# Patient Record
Sex: Female | Born: 1965 | Race: White | Hispanic: No | Marital: Single | Smoking: Former smoker
Health system: Southern US, Community
[De-identification: ages and names within clinical notes are randomized; demographics above are authoritative.]

## PROBLEM LIST (undated history)

## (undated) DIAGNOSIS — E785 Hyperlipidemia, unspecified: Secondary | ICD-10-CM

## (undated) DIAGNOSIS — K219 Gastro-esophageal reflux disease without esophagitis: Secondary | ICD-10-CM

## (undated) DIAGNOSIS — I10 Essential (primary) hypertension: Secondary | ICD-10-CM

## (undated) DIAGNOSIS — N809 Endometriosis, unspecified: Secondary | ICD-10-CM

## (undated) DIAGNOSIS — M1990 Unspecified osteoarthritis, unspecified site: Secondary | ICD-10-CM

## (undated) DIAGNOSIS — E119 Type 2 diabetes mellitus without complications: Secondary | ICD-10-CM

## (undated) DIAGNOSIS — C801 Malignant (primary) neoplasm, unspecified: Secondary | ICD-10-CM

## (undated) DIAGNOSIS — F32A Depression, unspecified: Secondary | ICD-10-CM

## (undated) HISTORY — PX: DIAGNOSTIC LAPAROSCOPY: SUR761

## (undated) HISTORY — DX: Hyperlipidemia, unspecified: E78.5

---

## 2000-01-20 ENCOUNTER — Other Ambulatory Visit: Admission: RE | Admit: 2000-01-20 | Discharge: 2000-01-20 | Payer: Self-pay | Admitting: Gynecology

## 2011-02-09 ENCOUNTER — Ambulatory Visit: Payer: Self-pay | Admitting: Family Medicine

## 2012-06-06 ENCOUNTER — Ambulatory Visit: Payer: Self-pay

## 2012-06-16 ENCOUNTER — Ambulatory Visit: Payer: Self-pay

## 2012-06-16 HISTORY — PX: DILATION AND CURETTAGE OF UTERUS: SHX78

## 2012-10-17 ENCOUNTER — Emergency Department: Payer: Self-pay | Admitting: Emergency Medicine

## 2012-10-17 LAB — COMPREHENSIVE METABOLIC PANEL
Alkaline Phosphatase: 125 U/L (ref 50–136)
Anion Gap: 7 (ref 7–16)
BUN: 6 mg/dL — ABNORMAL LOW (ref 7–18)
Bilirubin,Total: 0.5 mg/dL (ref 0.2–1.0)
Chloride: 105 mmol/L (ref 98–107)
Co2: 26 mmol/L (ref 21–32)
Creatinine: 0.59 mg/dL — ABNORMAL LOW (ref 0.60–1.30)
EGFR (African American): >60
Glucose: 113 mg/dL — ABNORMAL HIGH (ref 65–99)
Potassium: 3.6 mmol/L (ref 3.5–5.1)
SGOT(AST): 60 U/L — ABNORMAL HIGH (ref 15–37)
SGPT (ALT): 150 U/L — ABNORMAL HIGH (ref 12–78)
Sodium: 138 mmol/L (ref 136–145)
Total Protein: 7.6 g/dL (ref 6.4–8.2)

## 2012-10-17 LAB — CBC WITH DIFFERENTIAL/PLATELET
Basophil #: 0 10*3/uL (ref 0.0–0.1)
Eosinophil #: 0 10*3/uL (ref 0.0–0.7)
Eosinophil %: 0.5 %
HGB: 12.9 g/dL (ref 12.0–16.0)
MCH: 30.8 pg (ref 26.0–34.0)
MCHC: 34.4 g/dL (ref 32.0–36.0)
Monocyte %: 5.2 %
Neutrophil #: 5.4 10*3/uL (ref 1.4–6.5)
Neutrophil %: 78.5 %
RDW: 13.1 % (ref 11.5–14.5)
WBC: 6.9 10*3/uL (ref 3.6–11.0)

## 2012-10-17 LAB — TROPONIN I: Troponin-I: <0.02 ng/mL

## 2013-09-03 ENCOUNTER — Ambulatory Visit: Payer: Self-pay | Admitting: Family Medicine

## 2015-01-14 NOTE — Op Note (Signed)
PATIENT NAME:  Patricia Caldwell, Patricia Caldwell MR#:  [id number] DATE OF BIRTH:  14-Apr-1966  DATE OF PROCEDURE:  06/16/2012  PREOPERATIVE DIAGNOSIS: Abnormal uterine bleeding, retained intrauterine device.   POSTOPERATIVE DIAGNOSIS:  Abnormal uterine bleeding, retained intrauterine device.    OPERATIONS PERFORMED:  1. Dilation and curettage.  2. Hysteroscopy.  3. Removal of intrauterine device.   SURGEON: Wonda Cheng. Laurey Morale, M.D.   ANESTHESIA: General.   OPERATIVE FINDINGS: Good pelvic support, uterus sounded to 10 cm, intrauterine device.   DESCRIPTION OF PROCEDURE: After adequate general anesthesia, the patient was prepped and draped in routine fashion. The cervix was grasped with Jacob's tenaculum demonstrating good pelvic support. The cervix was dilated with ease. The uterine cavity was visualized which was essentially normal except the IUD. Hysteroscopy instruments were removed. IUD was removed. Endometrial curettage was performed with return of minimal amount of tissue. The patient tolerated the procedure well and left the Operating Room in good condition. Sponge and needle counts were said to be correct at the end of the procedure.   ____________________________ Wonda Cheng. Laurey Morale, MD pjr:ap D: 06/16/2012 13:04:59 ET T: 06/16/2012 13:26:58 ET JOB#: [id number]  cc: Wonda Cheng. Laurey Morale, MD, <Dictator> Rosina Lowenstein MD ELECTRONICALLY SIGNED 06/19/2012 22:55

## 2015-06-30 ENCOUNTER — Other Ambulatory Visit: Payer: Self-pay | Admitting: Family Medicine

## 2015-06-30 DIAGNOSIS — Z1231 Encounter for screening mammogram for malignant neoplasm of breast: Secondary | ICD-10-CM

## 2015-07-08 ENCOUNTER — Ambulatory Visit
Admission: RE | Admit: 2015-07-08 | Discharge: 2015-07-08 | Disposition: A | Discharge status: Home / Self Care | Payer: 59 | Source: Ambulatory Visit | Attending: Family Medicine | Admitting: Family Medicine

## 2015-07-08 DIAGNOSIS — Z1231 Encounter for screening mammogram for malignant neoplasm of breast: Secondary | ICD-10-CM | POA: Insufficient documentation

## 2017-03-09 ENCOUNTER — Other Ambulatory Visit: Payer: Self-pay | Admitting: Obstetrics and Gynecology

## 2017-03-09 DIAGNOSIS — Z1231 Encounter for screening mammogram for malignant neoplasm of breast: Secondary | ICD-10-CM

## 2017-03-25 ENCOUNTER — Ambulatory Visit
Admission: RE | Admit: 2017-03-25 | Discharge: 2017-03-25 | Disposition: A | Discharge status: Home / Self Care | Payer: 59 | Source: Ambulatory Visit | Attending: Obstetrics and Gynecology | Admitting: Obstetrics and Gynecology

## 2017-03-25 ENCOUNTER — Encounter: Payer: Self-pay | Admitting: Radiology

## 2017-03-25 DIAGNOSIS — Z1231 Encounter for screening mammogram for malignant neoplasm of breast: Secondary | ICD-10-CM | POA: Diagnosis present

## 2018-07-10 ENCOUNTER — Ambulatory Visit: Payer: 59

## 2019-12-19 ENCOUNTER — Other Ambulatory Visit: Payer: Self-pay | Admitting: Family Medicine

## 2019-12-19 DIAGNOSIS — Z1231 Encounter for screening mammogram for malignant neoplasm of breast: Secondary | ICD-10-CM

## 2020-01-01 ENCOUNTER — Ambulatory Visit
Admission: RE | Admit: 2020-01-01 | Discharge: 2020-01-01 | Disposition: A | Discharge status: Home / Self Care | Payer: Self-pay | Source: Ambulatory Visit | Attending: Family Medicine | Admitting: Family Medicine

## 2020-01-01 DIAGNOSIS — Z1231 Encounter for screening mammogram for malignant neoplasm of breast: Secondary | ICD-10-CM | POA: Insufficient documentation

## 2021-09-22 ENCOUNTER — Other Ambulatory Visit: Payer: Self-pay | Admitting: Family Medicine

## 2021-09-22 DIAGNOSIS — Z1231 Encounter for screening mammogram for malignant neoplasm of breast: Secondary | ICD-10-CM

## 2021-10-04 ENCOUNTER — Encounter: Payer: Self-pay | Admitting: Family Medicine

## 2021-10-06 ENCOUNTER — Telehealth: Payer: Self-pay

## 2021-10-06 NOTE — Telephone Encounter (Signed)
CALLED PATIENT NO ANSWER LEFT VOICEMAIL FOR A CALL BACK  letter sent 

## 2021-10-06 NOTE — Telephone Encounter (Signed)
CALLED PATIENT NO ANSWER LEFT VOICEMAIL FOR A CALL BACK ? ?

## 2021-11-30 ENCOUNTER — Ambulatory Visit
Admission: RE | Admit: 2021-11-30 | Discharge: 2021-11-30 | Disposition: A | Discharge status: Home / Self Care | Payer: BC Managed Care – PPO | Source: Ambulatory Visit | Attending: Family Medicine | Admitting: Family Medicine

## 2021-11-30 ENCOUNTER — Other Ambulatory Visit: Payer: Self-pay

## 2021-11-30 DIAGNOSIS — Z1231 Encounter for screening mammogram for malignant neoplasm of breast: Secondary | ICD-10-CM | POA: Diagnosis not present

## 2021-12-03 ENCOUNTER — Other Ambulatory Visit: Payer: Self-pay | Admitting: Family Medicine

## 2021-12-03 DIAGNOSIS — R921 Mammographic calcification found on diagnostic imaging of breast: Secondary | ICD-10-CM

## 2021-12-03 DIAGNOSIS — R928 Other abnormal and inconclusive findings on diagnostic imaging of breast: Secondary | ICD-10-CM

## 2021-12-22 ENCOUNTER — Other Ambulatory Visit: Payer: Self-pay

## 2021-12-22 ENCOUNTER — Ambulatory Visit
Admission: RE | Admit: 2021-12-22 | Discharge: 2021-12-22 | Disposition: A | Discharge status: Home / Self Care | Payer: BC Managed Care – PPO | Source: Ambulatory Visit | Attending: Family Medicine | Admitting: Family Medicine

## 2021-12-22 DIAGNOSIS — R921 Mammographic calcification found on diagnostic imaging of breast: Secondary | ICD-10-CM | POA: Insufficient documentation

## 2021-12-22 DIAGNOSIS — R928 Other abnormal and inconclusive findings on diagnostic imaging of breast: Secondary | ICD-10-CM | POA: Diagnosis not present

## 2021-12-22 DIAGNOSIS — R922 Inconclusive mammogram: Secondary | ICD-10-CM | POA: Diagnosis not present

## 2021-12-23 ENCOUNTER — Other Ambulatory Visit: Payer: Self-pay | Admitting: Gerontology

## 2021-12-23 ENCOUNTER — Other Ambulatory Visit: Payer: Self-pay | Admitting: Family Medicine

## 2022-01-06 ENCOUNTER — Other Ambulatory Visit: Payer: Self-pay | Admitting: Family Medicine

## 2022-01-06 DIAGNOSIS — R921 Mammographic calcification found on diagnostic imaging of breast: Secondary | ICD-10-CM

## 2022-01-06 DIAGNOSIS — R928 Other abnormal and inconclusive findings on diagnostic imaging of breast: Secondary | ICD-10-CM

## 2022-01-27 ENCOUNTER — Ambulatory Visit
Admission: RE | Admit: 2022-01-27 | Discharge: 2022-01-27 | Disposition: A | Discharge status: Home / Self Care | Payer: BC Managed Care – PPO | Source: Ambulatory Visit | Attending: Family Medicine | Admitting: Family Medicine

## 2022-01-27 DIAGNOSIS — R921 Mammographic calcification found on diagnostic imaging of breast: Secondary | ICD-10-CM

## 2022-01-27 DIAGNOSIS — R928 Other abnormal and inconclusive findings on diagnostic imaging of breast: Secondary | ICD-10-CM | POA: Diagnosis not present

## 2022-01-27 DIAGNOSIS — N6021 Fibroadenosis of right breast: Secondary | ICD-10-CM | POA: Diagnosis not present

## 2022-01-27 HISTORY — PX: BREAST BIOPSY: SHX20

## 2022-01-28 LAB — SURGICAL PATHOLOGY

## 2022-04-10 ENCOUNTER — Emergency Department: Payer: BC Managed Care – PPO

## 2022-04-10 ENCOUNTER — Other Ambulatory Visit: Payer: Self-pay

## 2022-04-10 ENCOUNTER — Emergency Department
Admission: EM | Admit: 2022-04-10 | Discharge: 2022-04-10 | Disposition: A | Discharge status: Home / Self Care | Payer: BC Managed Care – PPO | Attending: Emergency Medicine | Admitting: Emergency Medicine

## 2022-04-10 DIAGNOSIS — K8050 Calculus of bile duct without cholangitis or cholecystitis without obstruction: Secondary | ICD-10-CM

## 2022-04-10 DIAGNOSIS — R079 Chest pain, unspecified: Secondary | ICD-10-CM | POA: Diagnosis not present

## 2022-04-10 DIAGNOSIS — I10 Essential (primary) hypertension: Secondary | ICD-10-CM | POA: Diagnosis not present

## 2022-04-10 DIAGNOSIS — K8020 Calculus of gallbladder without cholecystitis without obstruction: Secondary | ICD-10-CM | POA: Diagnosis not present

## 2022-04-10 DIAGNOSIS — E119 Type 2 diabetes mellitus without complications: Secondary | ICD-10-CM | POA: Insufficient documentation

## 2022-04-10 DIAGNOSIS — R1013 Epigastric pain: Secondary | ICD-10-CM | POA: Diagnosis not present

## 2022-04-10 DIAGNOSIS — K760 Fatty (change of) liver, not elsewhere classified: Secondary | ICD-10-CM | POA: Diagnosis not present

## 2022-04-10 DIAGNOSIS — R1011 Right upper quadrant pain: Secondary | ICD-10-CM | POA: Diagnosis not present

## 2022-04-10 HISTORY — DX: Type 2 diabetes mellitus without complications: E11.9

## 2022-04-10 HISTORY — DX: Essential (primary) hypertension: I10

## 2022-04-10 LAB — COMPREHENSIVE METABOLIC PANEL
ALT: 70 U/L — ABNORMAL HIGH (ref 0–44)
AST: 117 U/L — ABNORMAL HIGH (ref 15–41)
Albumin: 3.9 g/dL (ref 3.5–5.0)
Alkaline Phosphatase: 125 U/L (ref 38–126)
Anion gap: 8 (ref 5–15)
BUN: 18 mg/dL (ref 6–20)
CO2: 30 mmol/L (ref 22–32)
Calcium: 8.8 mg/dL — ABNORMAL LOW (ref 8.9–10.3)
Chloride: 102 mmol/L (ref 98–111)
Creatinine, Ser: 0.61 mg/dL (ref 0.44–1.00)
GFR, Estimated: >60 mL/min (ref >60)
Glucose, Bld: 119 mg/dL — ABNORMAL HIGH (ref 70–99)
Potassium: 3.2 mmol/L — ABNORMAL LOW (ref 3.5–5.1)
Sodium: 140 mmol/L (ref 135–145)
Total Bilirubin: 0.7 mg/dL (ref 0.3–1.2)
Total Protein: 7.1 g/dL (ref 6.5–8.1)

## 2022-04-10 LAB — CBC
HCT: 42.8 % (ref 36.0–46.0)
Hemoglobin: 14.4 g/dL (ref 12.0–15.0)
MCH: 28.6 pg (ref 26.0–34.0)
MCHC: 33.6 g/dL (ref 30.0–36.0)
MCV: 85.1 fL (ref 80.0–100.0)
Platelets: 358 10*3/uL (ref 150–400)
RBC: 5.03 MIL/uL (ref 3.87–5.11)
RDW: 13.1 % (ref 11.5–15.5)
WBC: 10.7 10*3/uL — ABNORMAL HIGH (ref 4.0–10.5)
nRBC: 0 % (ref 0.0–0.2)

## 2022-04-10 LAB — TROPONIN I (HIGH SENSITIVITY): Troponin I (High Sensitivity): 4 ng/L (ref <18)

## 2022-04-10 LAB — LIPASE, BLOOD: Lipase: 54 U/L — ABNORMAL HIGH (ref 11–51)

## 2022-04-10 NOTE — ED Triage Notes (Signed)
First RN note: pt to ED via POV, states epigastric abd pain and c/o "heaviness on my chest". Pt states pain started earlier today at approx 1300. Pt denies radiations of heaviness in her chest. Pt also c/o SOB, able to speak in full and complete sentences at this time. Pt also states has passed gas but has not relieved her symptoms.   Pt A&O x4, NAD noted, ambulatory to triage desk, able to speak in full and complete sentences, pt states thinks it is her gallbladder at this time.

## 2022-04-10 NOTE — ED Provider Notes (Signed)
[facility] Provider Note    Event Date/Time   First MD Initiated Contact with Patient 04/10/22 2039     (approximate)  History   Chief Complaint: Abdominal Pain and Chest Pain  HPI  Patricia Caldwell is a 56 y.o. female with a past medical history of diabetes, hypertension, presents emergency department for epigastric pain.  According to the patient since around 2 PM today she has been experiencing pain in epigastrium.  Patient denies any chest pain or significant shortness of breath.  Patient denies any similar pains previously.  Denies any alcohol use or Tylenol use.  Patient still has her gallbladder.  Pain did occur after eating lunch.  Physical Exam   Triage Vital Signs: ED Triage Vitals  Enc Vitals Group     BP 04/10/22 1940 135/83     Pulse Rate 04/10/22 1940 83     Resp 04/10/22 1940 16     Temp 04/10/22 1940 98.4 F (36.9 C)     Temp Source 04/10/22 1940 Oral     SpO2 04/10/22 1940 95 %     Weight 04/10/22 1939 202 lb (91.6 kg)     Height 04/10/22 1939 '5\' 5"'$  (1.651 m)     Head Circumference --      Peak Flow --      Pain Score 04/10/22 1934 7     Pain Loc --      Pain Edu? --      Excl. in [place]? --     Most recent vital signs: Vitals:   04/10/22 1940  BP: 135/83  Pulse: 83  Resp: 16  Temp: 98.4 F (36.9 C)  SpO2: 95%    General: Awake, no distress.  CV:  Good peripheral perfusion.  Regular rate and rhythm  Resp:  Normal effort.  Equal breath sounds bilaterally.  Abd:  No distention.  Soft, mild epigastric tenderness to palpation no rebound or guarding.  No significant right upper quadrant tenderness.    ED Results / Procedures / Treatments   EKG  EKG viewed and interpreted by myself shows a normal sinus rhythm at 83 bpm with a narrow QRS, normal axis, normal intervals, no concerning ST changes.  RADIOLOGY  I have personally reviewed and interpreted the chest x-ray images no acute abnormality seen on my  evaluation. Radiology has read the chest x-ray is negative Ultrasound shows cholelithiasis without cholecystitis  MEDICATIONS ORDERED IN ED: Medications - No data to display   IMPRESSION / MDM / [place] / ED COURSE  I reviewed the triage vital signs and the nursing notes.  Patient's presentation is most consistent with acute presentation with potential threat to life or bodily function.  Patient presents emergency department for epigastric discomfort.  According to the patient since around 2 PM today she has been experiencing pain in the epigastrium although states it is considerably improved now compared to earlier today.  Patient does have mild epigastric tenderness to palpation.  Patient's labs have resulted showing moderate LFT elevation as well as a very slight lipase elevation.  Slight leukocytosis on CBC.  Reassuringly negative troponin.  Differential would include cholecystitis, biliary colic, choledocholithiasis, pancreatitis, gastritis, peptic ulcer disease.  We will obtain a right upper quadrant ultrasound to further evaluate.  Patient agreeable to plan of care.  Patient denies any alcohol or Tylenol use denies any prior liver disease.  Ultrasound is resulted showing cholelithiasis without acute signs of cholecystitis.  Patient states she is now pain-free  and without any intervention in the emergency department.  We will feed the patient in the emergency department if the patient's pain returns we will consult surgery for symptomatic cholelithiasis/biliary colic.  If pain remains subsided we will discharge with referral to surgery to discuss outpatient cholecystectomy.  Patient agreeable to plan.   Patient has eaten food with no return of discomfort.  Suspect likely biliary colic.  Discussed with the patient calling the surgeon on Monday and adhering to a low-fat diet.  I also discussed if the pain returns she should return immediately to the emergency department she should  also return if she has any fever scleral icterus or jaundice.  Patient agreeable to plan of care.   FINAL CLINICAL IMPRESSION(S) / ED DIAGNOSES   Epigastric pain    Note:  This document was prepared using Dragon voice recognition software and may include unintentional dictation errors.   Harvest Dark, MD 04/10/22 2207

## 2022-04-10 NOTE — Discharge Instructions (Addendum)
As we discussed please call this number provided for general surgery Monday morning to arrange a follow-up appointment as soon as possible.  If you have any return of pain, fever, yellowing of the eyes or skin please return immediately to the emergency department for further evaluation.  As we discussed please adhere to a low-fat/low oil diet over the next several weeks.

## 2022-04-15 ENCOUNTER — Ambulatory Visit: Payer: Self-pay | Admitting: Surgery

## 2022-04-15 ENCOUNTER — Encounter: Payer: Self-pay | Admitting: Surgery

## 2022-04-15 ENCOUNTER — Ambulatory Visit: Payer: BC Managed Care – PPO | Admitting: Surgery

## 2022-04-15 VITALS — BP 119/69 | HR 73 | Temp 97.9°F | Ht 65.0 in | Wt 206.8 lb

## 2022-04-15 DIAGNOSIS — K8010 Calculus of gallbladder with chronic cholecystitis without obstruction: Secondary | ICD-10-CM

## 2022-04-15 NOTE — Progress Notes (Signed)
Patient ID: Patricia Caldwell, female   DOB: 1966-01-20, 56 y.o.   MRN: [id number]  Chief Complaint: Gallstones  History of Present Illness Patricia Caldwell is a 56 y.o. female with an isolated episode of severe epigastric pain, nonradiating, following a meal of wings from wing stop.  ED evaluation showed mild AST and alk phos abnormalities but otherwise normal.  Ultrasound showed stones within normal common bile duct.  He has been avoiding fried or greasy foods since that time.  Past Medical History Past Medical History:  Diagnosis Date   Diabetes mellitus without complication ([place])    Hypertension       Past Surgical History:  Procedure Laterality Date   BREAST BIOPSY Right 01/27/2022   stereo bx-calcs "X" clip-path pending    No Known Allergies  Current Outpatient Medications  Medication Sig Dispense Refill   esomeprazole (NEXIUM) 40 MG capsule Take 40 mg by mouth daily.     FLUoxetine (PROZAC) 20 MG capsule Take 60 mg by mouth every morning.     JARDIANCE 10 MG TABS tablet Take 10 mg by mouth daily.     propranolol (INDERAL) 10 MG tablet Take 10 mg by mouth 2 (two) times daily.     tirzepatide (MOUNJARO) 15 MG/0.5ML Pen Inject 15 mg into the skin once a week.     No current facility-administered medications for this visit.    Family History Family History  Problem Relation Age of Onset   Breast cancer Maternal Aunt 85   Breast cancer Maternal Grandmother 28      Social History Social History   Tobacco Use   Smoking status: Former    Types: Cigarettes    Quit date: 2020    Years since quitting: 3.5   Smokeless tobacco: Never  Substance Use Topics   Alcohol use: Never   Drug use: Never        Review of Systems  Constitutional: Negative.   HENT: Negative.    Eyes: Negative.   Respiratory:  Positive for shortness of breath.   Cardiovascular: Negative.   Gastrointestinal:  Positive for abdominal pain.  Genitourinary: Negative.   Skin: Negative.    Neurological: Negative.   Psychiatric/Behavioral: Negative.        Physical Exam Blood pressure 119/69, pulse 73, temperature 97.9 F (36.6 C), temperature source Oral, height _0  (1.651 m), weight 206 lb 12.8 oz (93.8 kg), last menstrual period 01/25/2017, SpO2 93 %. Last Weight  Most recent update: 04/15/2022  4:15 PM    Weight  93.8 kg (206 lb 12.8 oz)             CONSTITUTIONAL: Well developed, and nourished, appropriately responsive and aware without distress.   EYES: Sclera non-icteric.   EARS, NOSE, MOUTH AND THROAT:  The oropharynx is clear. Oral mucosa is pink and moist.   Hearing is intact to voice.  NECK: Trachea is midline, and there is no jugular venous distension.  LYMPH NODES:  Lymph nodes in the neck are not enlarged. RESPIRATORY:  Lungs are clear, and breath sounds are equal bilaterally. Normal respiratory effort without pathologic use of accessory muscles. CARDIOVASCULAR: Heart is regular in rate and rhythm. GI: The abdomen is soft, nontender, and nondistended. There were no palpable masses. I did not appreciate hepatosplenomegaly. There were normal bowel sounds. MUSCULOSKELETAL:  Symmetrical muscle tone appreciated in all four extremities.    SKIN: Skin turgor is normal. No pathologic skin lesions appreciated.  NEUROLOGIC:  Motor and sensation appear grossly normal.  Cranial nerves are grossly without defect. PSYCH:  Alert and oriented to person, place and time. Affect is appropriate for situation.  Data Reviewed I have personally reviewed what is currently available of the patient's imaging, recent labs and medical records.   Labs:     Latest Ref Rng & Units 04/10/2022    7:46 PM 10/17/2012    3:46 PM 06/06/2012    9:57 AM  CBC  WBC 4.0 - 10.5 K/uL 10.7  6.9    Hemoglobin 12.0 - 15.0 g/dL 14.4  12.9    Hematocrit 36.0 - 46.0 % 42.8  37.4  39.2   Platelets 150 - 400 K/uL 358  249        Latest Ref Rng & Units 04/10/2022    7:46 PM 10/17/2012    3:46 PM   CMP  Glucose 70 - 99 mg/dL 119  113   BUN 6 - 20 mg/dL 18  6   Creatinine 0.44 - 1.00 mg/dL 0.61  0.59   Sodium 135 - 145 mmol/L 140  138   Potassium 3.5 - 5.1 mmol/L 3.2  3.6   Chloride 98 - 111 mmol/L 102  105   CO2 22 - 32 mmol/L 30  26   Calcium 8.9 - 10.3 mg/dL 8.8  8.5   Total Protein 6.5 - 8.1 g/dL 7.1  7.6   Total Bilirubin 0.3 - 1.2 mg/dL 0.7  0.5   Alkaline Phos 38 - 126 U/L 125  125   AST 15 - 41 U/L 117  60   ALT 0 - 44 U/L 70  150       Imaging: Radiology review:  CLINICAL DATA:  Right upper quadrant abdominal pain   EXAM: ULTRASOUND ABDOMEN LIMITED RIGHT UPPER QUADRANT   COMPARISON:  None Available.   FINDINGS: Gallbladder:   Multiple layering gallstones are seen within the gallbladder. The gallbladder, however, is mildly decompressed and no pericholecystic fluid is identified. The sonographic Percell Miller sign is reportedly negative.   Common bile duct:   Diameter: 5 mm in proximal diameter   Liver:   Hepatic parenchymal echogenicity is diffusely increased and there is mild coarsening of the hepatic echotexture in keeping with mild hepatic steatosis. No focal intrahepatic masses are seen and there is no intrahepatic biliary ductal dilation. Portal vein is patent on color Doppler imaging with normal direction of blood flow towards the liver.   Other: None.   IMPRESSION: 1. Cholelithiasis without sonographic evidence of acute cholecystitis. 2. Mild hepatic steatosis.     Electronically Signed   By: Fidela Salisbury M.D.   On: 04/10/2022 21:15 Within last 24 hrs: No results found.  Assessment    Chronic calculus cholecystitis with biliary colic  There are no problems to display for this patient.   Plan    Robotic cholecystectomy with ICG imaging  This was discussed thoroughly.  Optimal plan is for robotic cholecystectomy utilizing ICG imaging. Risks and benefits have been discussed with the patient which include but are not limited to  anesthesia, bleeding, infection, biliary ductal injury, resulting in leak or stenosis, other associated unanticipated injuries affiliated with laparoscopic surgery.   Reviewed that removing the gallbladder will only address the symptoms related to the gallbladder itself.  I believe there is the desire to proceed, accepting the risks with understanding.  Questions elicited and answered to satisfaction.    No guarantees ever expressed or implied.   Face-to-face time spent with the patient and accompanying care providers(if present) was 20  minutes, with more than 50% of the time spent counseling, educating, and coordinating care of the patient.    These notes generated with voice recognition software. I apologize for typographical errors.  Ronny Bacon M.D., FACS 04/15/2022, 7:23 PM

## 2022-04-15 NOTE — H&P (View-Only) (Signed)
Patient ID: CHEVETTE FEE, female   DOB: 1966-01-20, 56 y.o.   MRN: [id number]  Chief Complaint: Gallstones  History of Present Illness KELBY LOTSPEICH is a 56 y.o. female with an isolated episode of severe epigastric pain, nonradiating, following a meal of wings from wing stop.  ED evaluation showed mild AST and alk phos abnormalities but otherwise normal.  Ultrasound showed stones within normal common bile duct.  He has been avoiding fried or greasy foods since that time.  Past Medical History Past Medical History:  Diagnosis Date   Diabetes mellitus without complication ([place])    Hypertension       Past Surgical History:  Procedure Laterality Date   BREAST BIOPSY Right 01/27/2022   stereo bx-calcs "X" clip-path pending    No Known Allergies  Current Outpatient Medications  Medication Sig Dispense Refill   esomeprazole (NEXIUM) 40 MG capsule Take 40 mg by mouth daily.     FLUoxetine (PROZAC) 20 MG capsule Take 60 mg by mouth every morning.     JARDIANCE 10 MG TABS tablet Take 10 mg by mouth daily.     propranolol (INDERAL) 10 MG tablet Take 10 mg by mouth 2 (two) times daily.     tirzepatide (MOUNJARO) 15 MG/0.5ML Pen Inject 15 mg into the skin once a week.     No current facility-administered medications for this visit.    Family History Family History  Problem Relation Age of Onset   Breast cancer Maternal Aunt 85   Breast cancer Maternal Grandmother 28      Social History Social History   Tobacco Use   Smoking status: Former    Types: Cigarettes    Quit date: 2020    Years since quitting: 3.5   Smokeless tobacco: Never  Substance Use Topics   Alcohol use: Never   Drug use: Never        Review of Systems  Constitutional: Negative.   HENT: Negative.    Eyes: Negative.   Respiratory:  Positive for shortness of breath.   Cardiovascular: Negative.   Gastrointestinal:  Positive for abdominal pain.  Genitourinary: Negative.   Skin: Negative.    Neurological: Negative.   Psychiatric/Behavioral: Negative.        Physical Exam Blood pressure 119/69, pulse 73, temperature 97.9 F (36.6 C), temperature source Oral, height _0  (1.651 m), weight 206 lb 12.8 oz (93.8 kg), last menstrual period 01/25/2017, SpO2 93 %. Last Weight  Most recent update: 04/15/2022  4:15 PM    Weight  93.8 kg (206 lb 12.8 oz)             CONSTITUTIONAL: Well developed, and nourished, appropriately responsive and aware without distress.   EYES: Sclera non-icteric.   EARS, NOSE, MOUTH AND THROAT:  The oropharynx is clear. Oral mucosa is pink and moist.   Hearing is intact to voice.  NECK: Trachea is midline, and there is no jugular venous distension.  LYMPH NODES:  Lymph nodes in the neck are not enlarged. RESPIRATORY:  Lungs are clear, and breath sounds are equal bilaterally. Normal respiratory effort without pathologic use of accessory muscles. CARDIOVASCULAR: Heart is regular in rate and rhythm. GI: The abdomen is soft, nontender, and nondistended. There were no palpable masses. I did not appreciate hepatosplenomegaly. There were normal bowel sounds. MUSCULOSKELETAL:  Symmetrical muscle tone appreciated in all four extremities.    SKIN: Skin turgor is normal. No pathologic skin lesions appreciated.  NEUROLOGIC:  Motor and sensation appear grossly normal.  Cranial nerves are grossly without defect. PSYCH:  Alert and oriented to person, place and time. Affect is appropriate for situation.  Data Reviewed I have personally reviewed what is currently available of the patient's imaging, recent labs and medical records.   Labs:     Latest Ref Rng & Units 04/10/2022    7:46 PM 10/17/2012    3:46 PM 06/06/2012    9:57 AM  CBC  WBC 4.0 - 10.5 K/uL 10.7  6.9    Hemoglobin 12.0 - 15.0 g/dL 14.4  12.9    Hematocrit 36.0 - 46.0 % 42.8  37.4  39.2   Platelets 150 - 400 K/uL 358  249        Latest Ref Rng & Units 04/10/2022    7:46 PM 10/17/2012    3:46 PM   CMP  Glucose 70 - 99 mg/dL 119  113   BUN 6 - 20 mg/dL 18  6   Creatinine 0.44 - 1.00 mg/dL 0.61  0.59   Sodium 135 - 145 mmol/L 140  138   Potassium 3.5 - 5.1 mmol/L 3.2  3.6   Chloride 98 - 111 mmol/L 102  105   CO2 22 - 32 mmol/L 30  26   Calcium 8.9 - 10.3 mg/dL 8.8  8.5   Total Protein 6.5 - 8.1 g/dL 7.1  7.6   Total Bilirubin 0.3 - 1.2 mg/dL 0.7  0.5   Alkaline Phos 38 - 126 U/L 125  125   AST 15 - 41 U/L 117  60   ALT 0 - 44 U/L 70  150       Imaging: Radiology review:  CLINICAL DATA:  Right upper quadrant abdominal pain   EXAM: ULTRASOUND ABDOMEN LIMITED RIGHT UPPER QUADRANT   COMPARISON:  None Available.   FINDINGS: Gallbladder:   Multiple layering gallstones are seen within the gallbladder. The gallbladder, however, is mildly decompressed and no pericholecystic fluid is identified. The sonographic Percell Miller sign is reportedly negative.   Common bile duct:   Diameter: 5 mm in proximal diameter   Liver:   Hepatic parenchymal echogenicity is diffusely increased and there is mild coarsening of the hepatic echotexture in keeping with mild hepatic steatosis. No focal intrahepatic masses are seen and there is no intrahepatic biliary ductal dilation. Portal vein is patent on color Doppler imaging with normal direction of blood flow towards the liver.   Other: None.   IMPRESSION: 1. Cholelithiasis without sonographic evidence of acute cholecystitis. 2. Mild hepatic steatosis.     Electronically Signed   By: Fidela Salisbury M.D.   On: 04/10/2022 21:15 Within last 24 hrs: No results found.  Assessment    Chronic calculus cholecystitis with biliary colic  There are no problems to display for this patient.   Plan    Robotic cholecystectomy with ICG imaging  This was discussed thoroughly.  Optimal plan is for robotic cholecystectomy utilizing ICG imaging. Risks and benefits have been discussed with the patient which include but are not limited to  anesthesia, bleeding, infection, biliary ductal injury, resulting in leak or stenosis, other associated unanticipated injuries affiliated with laparoscopic surgery.   Reviewed that removing the gallbladder will only address the symptoms related to the gallbladder itself.  I believe there is the desire to proceed, accepting the risks with understanding.  Questions elicited and answered to satisfaction.    No guarantees ever expressed or implied.   Face-to-face time spent with the patient and accompanying care providers(if present) was 20  minutes, with more than 50% of the time spent counseling, educating, and coordinating care of the patient.    These notes generated with voice recognition software. I apologize for typographical errors.  Ronny Bacon M.D., FACS 04/15/2022, 7:23 PM

## 2022-04-15 NOTE — Patient Instructions (Signed)
Our surgery scheduler Barbara will call you within 24-48 hours to get you scheduled. If you have not heard from her after 48 hours, please call our office. Have the blue sheet available when she calls to write down important information.   If you have any concerns or questions, please feel free to call our office.    Minimally Invasive Cholecystectomy  Minimally invasive cholecystectomy is surgery to remove the gallbladder. The gallbladder is a pear-shaped organ that lies beneath the liver on the right side of the body. The gallbladder stores bile, which is a fluid that helps the body digest fats. Cholecystectomy is often done to treat inflammation (irritation and swelling) of the gallbladder (cholecystitis). This condition is usually caused by a buildup of gallstones (cholelithiasis) in the gallbladder or when the fluid in the gall bladder becomes stagnant because gallstones get stuck in the ducts (tubes) and block the flow of bile. This can result in inflammation and pain. In severe cases, emergency surgery may be required. This procedure is done through small incisions in the abdomen, instead of one large incision. It is also called laparoscopic surgery. A thin scope with a camera (laparoscope) is inserted through one incision. Then surgical instruments are inserted through the other incisions. In some cases, a minimally invasive surgery may need to be changed to a surgery that is done through a larger incision. This is called open surgery. Tell a health care provider about: Any allergies you have. All medicines you are taking, including vitamins, herbs, eye drops, creams, and over-the-counter medicines. Any problems you or family members have had with anesthetic medicines. Any bleeding problems you have. Any surgeries you have had. Any medical conditions you have. Whether you are pregnant or may be pregnant. What are the risks? Generally, this is a safe procedure. However, problems may occur,  including: Infection. Bleeding. Allergic reactions to medicines. Damage to nearby structures or organs. A gallstone remaining in the common bile duct. The common bile duct carries bile from the gallbladder to the small intestine. A bile leak from the liver or cystic duct after your gallbladder is removed. What happens before the procedure? When to stop eating and drinking Follow instructions from your health care provider about what you may eat and drink before your procedure. These may include: 8 hours before the procedure Stop eating most foods. Do not eat meat, fried foods, or fatty foods. Eat only light foods, such as toast or crackers. All liquids are okay except energy drinks and alcohol. 6 hours before the procedure Stop eating. Drink only clear liquids, such as water, clear fruit juice, black coffee, plain tea, and sports drinks. Do not drink energy drinks or alcohol. 2 hours before the procedure Stop drinking all liquids. You may be allowed to take medicines with small sips of water. If you do not follow your health care provider's instructions, your procedure may be delayed or canceled. Medicines Ask your health care provider about: Changing or stopping your regular medicines. This is especially important if you are taking diabetes medicines or blood thinners. Taking medicines such as aspirin and ibuprofen. These medicines can thin your blood. Do not take these medicines unless your health care provider tells you to take them. Taking over-the-counter medicines, vitamins, herbs, and supplements. General instructions If you will be going home right after the procedure, plan to have a responsible adult: Take you home from the hospital or clinic. You will not be allowed to drive. Care for you for the time you are   told. Do not use any products that contain nicotine or tobacco for at least 4 weeks before the procedure. These products include cigarettes, chewing tobacco, and vaping  devices, such as e-cigarettes. If you need help quitting, ask your health care provider. Ask your health care provider: How your surgery site will be marked. What steps will be taken to help prevent infection. These may include: Removing hair at the surgery site. Washing skin with a germ-killing soap. Taking antibiotic medicine. What happens during the procedure?  An IV will be inserted into one of your veins. You will be given one or both of the following: A medicine to help you relax (sedative). A medicine to make you fall asleep (general anesthetic). Your surgeon will make several small incisions in your abdomen. The laparoscope will be inserted through one of the small incisions. The camera on the laparoscope will send images to a monitor in the operating room. This lets your surgeon see inside your abdomen. A gas will be pumped into your abdomen. This will expand your abdomen to give the surgeon more room to perform the surgery. Other tools that are needed for the procedure will be inserted through the other incisions. The gallbladder will be removed through one of the incisions. Your common bile duct may be examined. If stones are found in the common bile duct, they may be removed. After your gallbladder has been removed, the incisions will be closed with stitches (sutures), staples, or skin glue. Your incisions will be covered with a bandage (dressing). The procedure may vary among health care providers and hospitals. What happens after the procedure? Your blood pressure, heart rate, breathing rate, and blood oxygen level will be monitored until you leave the hospital or clinic. You will be given medicines as needed to control your pain. You may have a drain placed in the incision. The drain will be removed a day or two after the procedure. Summary Minimally invasive cholecystectomy, also called laparoscopic cholecystectomy, is surgery to remove the gallbladder using small  incisions. Tell your health care provider about all the medical conditions you have and all the medicines you are taking for those conditions. Before the procedure, follow instructions about when to stop eating and drinking and changing or stopping medicines. Plan to have a responsible adult care for you for the time you are told after you leave the hospital or clinic. This information is not intended to replace advice given to you by your health care provider. Make sure you discuss any questions you have with your health care provider. Document Revised: 03/17/2021 Document Reviewed: 03/17/2021 Elsevier Patient Education  2023 [employer].  

## 2022-04-19 ENCOUNTER — Telehealth: Payer: Self-pay | Admitting: Surgery

## 2022-04-19 NOTE — Telephone Encounter (Signed)
Patient has been advised of Pre-Admission date/time, and Surgery date.  Surgery Date: 05/03/22 Preadmission Testing Date: 04/23/22 (phone 1p-5p)  Patient has been made aware to call [phone], between 1-3:00pm the day before surgery, to find out what time to arrive for surgery.

## 2022-04-23 ENCOUNTER — Inpatient Hospital Stay
Admission: RE | Admit: 2022-04-23 | Discharge: 2022-04-23 | Disposition: A | Discharge status: Home / Self Care | Payer: BC Managed Care – PPO | Source: Ambulatory Visit

## 2022-04-23 NOTE — Patient Instructions (Signed)
Your procedure is scheduled on: Monday, August 7 Report to the Registration Desk on the 1st floor of the [facility]. To find out your arrival time, please call [phone] between 1PM - 3PM on: Friday, August 4 If your arrival time is 6:00 am, do not arrive prior to that time as the [facility] entrance doors do not open until 6:00 am.  REMEMBER: Instructions that are not followed completely may result in serious medical risk, up to and including death; or upon the discretion of your surgeon and anesthesiologist your surgery may need to be rescheduled.  Do not eat or drink after midnight the night before surgery.  No gum chewing, lozengers or hard candies.  TAKE THESE MEDICATIONS THE MORNING OF SURGERY WITH A SIP OF WATER:  Esomeprazole (Nexium) - (take one the night before and one on the morning of surgery - helps to prevent nausea after surgery.) Fluoxetine (Prozac) Propranolol  Jardiance - hold for 3 days prior to surgery. Last day to TAKE Jardiance is Thursday, August 3. Resume AFTER surgery.  Tirzepatide Darcel Bayley) - hold for 7 days prior to surgery. Restart AFTER surgery on your normal day.  One week prior to surgery: starting July 31 Stop Anti-inflammatories (NSAIDS) such as Advil, Aleve, Ibuprofen, Motrin, Naproxen, Naprosyn and Aspirin based products such as Excedrin, Goodys Powder, BC Powder. Stop ANY OVER THE COUNTER supplements until after surgery. You may however, continue to take Tylenol if needed for pain up until the day of surgery.  No Alcohol for 24 hours before or after surgery.  No Smoking including e-cigarettes for 24 hours prior to surgery.  No chewable tobacco products for at least 6 hours prior to surgery.  No nicotine patches on the day of surgery.  Do not use any "recreational" drugs for at least a week prior to your surgery.  Please be advised that the combination of cocaine and anesthesia may have negative outcomes, up to and including death. If you  test positive for cocaine, your surgery will be cancelled.  On the morning of surgery brush your teeth with toothpaste and water, you may rinse your mouth with mouthwash if you wish. Do not swallow any toothpaste or mouthwash.  Use CHG Soap as directed on instruction sheet.  Do not wear jewelry, make-up, hairpins, clips or nail polish.  Do not wear lotions, powders, or perfumes.   Do not shave body from the neck down 48 hours prior to surgery just in case you cut yourself which could leave a site for infection.  Also, freshly shaved skin may become irritated if using the CHG soap.  Contact lenses, hearing aids and dentures may not be worn into surgery.  Do not bring valuables to the hospital. [facility] is not responsible for any missing/lost belongings or valuables.   Bring your C-PAP to the hospital with you in case you may have to spend the night.   Notify your doctor if there is any change in your medical condition (cold, fever, infection).  Wear comfortable clothing (specific to your surgery type) to the hospital.  After surgery, you can help prevent lung complications by doing breathing exercises.  Take deep breaths and cough every 1-2 hours. Your doctor may order a device called an Incentive Spirometer to help you take deep breaths. When coughing or sneezing, hold a pillow firmly against your incision with both hands. This is called "splinting." Doing this helps protect your incision. It also decreases belly discomfort.  If you are being discharged the  day of surgery, you will not be allowed to drive home. You will need a responsible adult (18 years or older) to drive you home and stay with you that night.   If you are taking public transportation, you will need to have a responsible adult (18 years or older) with you. Please confirm with your physician that it is acceptable to use public transportation.   Please call the [place] Dept. at [phone] if you  have any questions about these instructions.  Surgery Visitation Policy:  Patients undergoing a surgery or procedure may have two family members or support persons with them as long as the person is not COVID-19 positive or experiencing its symptoms.   Preparing for Surgery with [place] (CHG) Soap    Before surgery, you can play an important role by reducing the number of germs on your skin.  CHG (Chlorhexidine gluconate) soap is an antiseptic cleanser which kills germs and bonds with the skin to continue killing germs even after washing.  Please do not use if you have an allergy to CHG or antibacterial soaps. If your skin becomes reddened/irritated stop using the CHG.  1. Shower the NIGHT BEFORE SURGERY and the MORNING OF SURGERY with CHG soap.  2. If you choose to wash your hair, wash your hair first as usual with your normal shampoo.  3. After shampooing, rinse your hair and body thoroughly to remove the shampoo.  4. Use CHG as you would any other liquid soap. You can apply CHG directly to the skin and wash gently with a scrungie or a clean washcloth.  5. Apply the CHG soap to your body only from the neck down. Do not use on open wounds or open sores. Avoid contact with your eyes, ears, mouth, and genitals (private parts). Wash face and genitals (private parts) with your normal soap.  6. Wash thoroughly, paying special attention to the area where your surgery will be performed.  7. Thoroughly rinse your body with warm water.  8. Do not shower/wash with your normal soap after using and rinsing off the CHG soap.  9. Pat yourself dry with a clean towel.  10. Wear clean pajamas to bed the night before surgery.  12. Place clean sheets on your bed the night of your first shower and do not sleep with pets.  13. Shower again with the CHG soap on the day of surgery prior to arriving at the hospital.  14. Do not apply any deodorants/lotions/powders.  15. Please wear  clean clothes to the hospital.

## 2022-04-27 ENCOUNTER — Encounter
Admission: RE | Admit: 2022-04-27 | Discharge: 2022-04-27 | Disposition: A | Discharge status: Home / Self Care | Payer: BC Managed Care – PPO | Source: Ambulatory Visit | Attending: Surgery | Admitting: Surgery

## 2022-04-27 ENCOUNTER — Other Ambulatory Visit: Payer: Self-pay

## 2022-04-27 DIAGNOSIS — Z01812 Encounter for preprocedural laboratory examination: Secondary | ICD-10-CM

## 2022-04-27 HISTORY — DX: Gastro-esophageal reflux disease without esophagitis: K21.9

## 2022-04-27 HISTORY — DX: Unspecified osteoarthritis, unspecified site: M19.90

## 2022-04-27 HISTORY — DX: Endometriosis, unspecified: N80.9

## 2022-04-27 HISTORY — DX: Malignant (primary) neoplasm, unspecified: C80.1

## 2022-04-27 HISTORY — DX: Depression, unspecified: F32.A

## 2022-04-27 NOTE — Patient Instructions (Addendum)
Your procedure is scheduled on: 05/03/22 - Monday  Report to the Registration Desk on the 1st floor of the [facility]. To find out your arrival time, please call [phone] between 1PM - 3PM on: 04/30/22 - Friday If your arrival time is 6:00 am, do not arrive prior to that time as the [facility] entrance doors do not open until 6:00 am.  REMEMBER: Instructions that are not followed completely may result in serious medical risk, up to and including death; or upon the discretion of your surgeon and anesthesiologist your surgery may need to be rescheduled.  Do not eat food or drinking any fluids after midnight the night before surgery.  No gum chewing, lozengers or hard candies.  You may however, drink CLEAR liquids up to 2 hours before you are scheduled to arrive for your surgery. Do not drink anything within 2 hours of your scheduled arrival time.  TAKE THESE MEDICATIONS THE MORNING OF SURGERY WITH A SIP OF WATER:  - esomeprazole (NEXIUM) 40 MG capsule - (take one the night before and one on the morning of surgery - helps to prevent nausea after surgery.) - FLUoxetine (PROZAC) - propranolol (INDERAL)    Hold your tirzepatide [facility]) for 7 days prior to your surgery.  Stop taking your JARDIANCE beginning 04/30/22, you may resume taking on the day after surgery.  One week prior to surgery: Stop Anti-inflammatories (NSAIDS) such as Advil, Aleve, Ibuprofen, Motrin, Naproxen, Naprosyn and Aspirin based products such as Excedrin, Goodys Powder, BC Powder.  Stop ANY OVER THE COUNTER supplements until after surgery.  You may however, continue to take Tylenol if needed for pain up until the day of surgery.  No Alcohol for 24 hours before or after surgery.  No Smoking including e-cigarettes for 24 hours prior to surgery.  No chewable tobacco products for at least 6 hours prior to surgery.  No nicotine patches on the day of surgery.  Do not use any "recreational" drugs for at  least a week prior to your surgery.  Please be advised that the combination of cocaine and anesthesia may have negative outcomes, up to and including death. If you test positive for cocaine, your surgery will be cancelled.  On the morning of surgery brush your teeth with toothpaste and water, you may rinse your mouth with mouthwash if you wish. Do not swallow any toothpaste or mouthwash.  Do not wear jewelry, make-up, hairpins, clips or nail polish.  Do not wear lotions, powders, or perfumes.   Do not shave body from the neck down 48 hours prior to surgery just in case you cut yourself which could leave a site for infection.  Also, freshly shaved skin may become irritated if using the CHG soap.  Contact lenses, hearing aids and dentures may not be worn into surgery.  Do not bring valuables to the hospital. [facility] is not responsible for any missing/lost belongings or valuables.   Notify your doctor if there is any change in your medical condition (cold, fever, infection).  Wear comfortable clothing (specific to your surgery type) to the hospital.  After surgery, you can help prevent lung complications by doing breathing exercises.  Take deep breaths and cough every 1-2 hours. Your doctor may order a device called an Incentive Spirometer to help you take deep breaths. When coughing or sneezing, hold a pillow firmly against your incision with both hands. This is called "splinting." Doing this helps protect your incision. It also decreases belly discomfort.  If you  are being admitted to the hospital overnight, leave your suitcase in the car. After surgery it may be brought to your room.  If you are being discharged the day of surgery, you will not be allowed to drive home. You will need a responsible adult (18 years or older) to drive you home and stay with you that night.   If you are taking public transportation, you will need to have a responsible adult (18 years or older) with  you. Please confirm with your physician that it is acceptable to use public transportation.   Please call the [place] Dept. at [phone] if you have any questions about these instructions.  Surgery Visitation Policy:  Patients undergoing a surgery or procedure may have two family members or support persons with them as long as the person is not COVID-19 positive or experiencing its symptoms.   Inpatient Visitation:    Visiting hours are 7 a.m. to 8 p.m. Up to four visitors are allowed at one time in a patient room, including children. The visitors may rotate out with other people during the day. One designated support person (adult) may remain overnight.

## 2022-05-02 MED ORDER — INDOCYANINE GREEN 25 MG IV SOLR
1.2500 mg | Freq: Once | INTRAVENOUS | Status: AC
  Administered 2022-05-03: 1.25 mg via INTRAVENOUS
  Filled 2022-05-02: qty 0.5

## 2022-05-02 MED ORDER — SODIUM CHLORIDE 0.9 % IV SOLN: INTRAVENOUS | Status: DC

## 2022-05-02 MED ORDER — CELECOXIB 200 MG PO CAPS: 200.0000 mg | ORAL_CAPSULE | ORAL | Status: AC

## 2022-05-02 MED ORDER — ACETAMINOPHEN 500 MG PO TABS: 1000.0000 mg | ORAL_TABLET | ORAL | Status: AC

## 2022-05-02 MED ORDER — CHLORHEXIDINE GLUCONATE CLOTH 2 % EX PADS: 6.0000 | MEDICATED_PAD | Freq: Once | CUTANEOUS | Status: DC

## 2022-05-02 MED ORDER — GABAPENTIN 300 MG PO CAPS: 300.0000 mg | ORAL_CAPSULE | ORAL | Status: AC

## 2022-05-02 MED ORDER — CEFAZOLIN SODIUM-DEXTROSE 2-4 GM/100ML-% IV SOLN
2.0000 g | INTRAVENOUS | Status: AC
  Administered 2022-05-03: 2 g via INTRAVENOUS

## 2022-05-02 MED ORDER — CHLORHEXIDINE GLUCONATE 0.12 % MT SOLN: 15.0000 mL | Freq: Once | OROMUCOSAL | Status: AC

## 2022-05-02 MED ORDER — BUPIVACAINE LIPOSOME 1.3 % IJ SUSP: 20.0000 mL | Freq: Once | INTRAMUSCULAR | Status: DC

## 2022-05-02 MED ORDER — CHLORHEXIDINE GLUCONATE CLOTH 2 % EX PADS
6.0000 | MEDICATED_PAD | Freq: Once | CUTANEOUS | Status: AC
  Administered 2022-05-03: 6 via TOPICAL

## 2022-05-02 MED ORDER — ORAL CARE MOUTH RINSE: 15.0000 mL | Freq: Once | OROMUCOSAL | Status: AC

## 2022-05-03 ENCOUNTER — Encounter
Admission: RE | Disposition: A | Discharge status: Home / Self Care | Payer: Self-pay | Source: Home / Self Care | Attending: Surgery

## 2022-05-03 ENCOUNTER — Ambulatory Visit: Payer: BC Managed Care – PPO | Admitting: Certified Registered"

## 2022-05-03 ENCOUNTER — Ambulatory Visit
Admission: RE | Admit: 2022-05-03 | Discharge: 2022-05-03 | Disposition: A | Discharge status: Home / Self Care | Payer: BC Managed Care – PPO | Attending: Surgery | Admitting: Surgery

## 2022-05-03 ENCOUNTER — Encounter: Payer: Self-pay | Admitting: Surgery

## 2022-05-03 ENCOUNTER — Other Ambulatory Visit: Payer: Self-pay

## 2022-05-03 DIAGNOSIS — K8010 Calculus of gallbladder with chronic cholecystitis without obstruction: Secondary | ICD-10-CM | POA: Insufficient documentation

## 2022-05-03 DIAGNOSIS — F32A Depression, unspecified: Secondary | ICD-10-CM | POA: Diagnosis not present

## 2022-05-03 DIAGNOSIS — K219 Gastro-esophageal reflux disease without esophagitis: Secondary | ICD-10-CM | POA: Insufficient documentation

## 2022-05-03 DIAGNOSIS — E119 Type 2 diabetes mellitus without complications: Secondary | ICD-10-CM | POA: Diagnosis not present

## 2022-05-03 DIAGNOSIS — Z87891 Personal history of nicotine dependence: Secondary | ICD-10-CM | POA: Diagnosis not present

## 2022-05-03 DIAGNOSIS — K8020 Calculus of gallbladder without cholecystitis without obstruction: Secondary | ICD-10-CM | POA: Diagnosis not present

## 2022-05-03 DIAGNOSIS — I10 Essential (primary) hypertension: Secondary | ICD-10-CM | POA: Diagnosis not present

## 2022-05-03 DIAGNOSIS — Z01812 Encounter for preprocedural laboratory examination: Secondary | ICD-10-CM

## 2022-05-03 LAB — GLUCOSE, CAPILLARY
Glucose-Capillary: 143 mg/dL — ABNORMAL HIGH (ref 70–99)
Glucose-Capillary: 148 mg/dL — ABNORMAL HIGH (ref 70–99)

## 2022-05-03 SURGERY — CHOLECYSTECTOMY, ROBOT-ASSISTED, LAPAROSCOPIC
Anesthesia: General | Site: Abdomen

## 2022-05-03 MED ORDER — OXYCODONE HCL 5 MG PO TABS
ORAL_TABLET | ORAL | Status: AC
  Filled 2022-05-03: qty 1

## 2022-05-03 MED ORDER — 0.9 % SODIUM CHLORIDE (POUR BTL) OPTIME
TOPICAL | Status: DC | PRN
  Administered 2022-05-03: 500 mL

## 2022-05-03 MED ORDER — FENTANYL CITRATE (PF) 100 MCG/2ML IJ SOLN
INTRAMUSCULAR | Status: AC
  Filled 2022-05-03: qty 2

## 2022-05-03 MED ORDER — OXYCODONE HCL 5 MG PO TABS: 5.0000 mg | ORAL_TABLET | Freq: Four times a day (QID) | ORAL | 0 refills | Status: DC | PRN

## 2022-05-03 MED ORDER — OXYCODONE HCL 5 MG PO TABS
5.0000 mg | ORAL_TABLET | Freq: Once | ORAL | Status: AC | PRN
  Administered 2022-05-03: 5 mg via ORAL

## 2022-05-03 MED ORDER — CEFAZOLIN SODIUM-DEXTROSE 2-4 GM/100ML-% IV SOLN
INTRAVENOUS | Status: AC
  Filled 2022-05-03: qty 100

## 2022-05-03 MED ORDER — ONDANSETRON HCL 4 MG/2ML IJ SOLN
INTRAMUSCULAR | Status: DC | PRN
  Administered 2022-05-03: 4 mg via INTRAVENOUS

## 2022-05-03 MED ORDER — MIDAZOLAM HCL 2 MG/2ML IJ SOLN
INTRAMUSCULAR | Status: DC | PRN
  Administered 2022-05-03: 2 mg via INTRAVENOUS

## 2022-05-03 MED ORDER — MIDAZOLAM HCL 2 MG/2ML IJ SOLN
INTRAMUSCULAR | Status: AC
  Filled 2022-05-03: qty 2

## 2022-05-03 MED ORDER — LIDOCAINE HCL (CARDIAC) PF 100 MG/5ML IV SOSY
PREFILLED_SYRINGE | INTRAVENOUS | Status: DC | PRN
  Administered 2022-05-03: 50 mg via INTRAVENOUS

## 2022-05-03 MED ORDER — PROPOFOL 10 MG/ML IV BOLUS
INTRAVENOUS | Status: DC | PRN
  Administered 2022-05-03: 150 mg via INTRAVENOUS

## 2022-05-03 MED ORDER — LACTATED RINGERS IV SOLN: INTRAVENOUS | Status: DC | PRN

## 2022-05-03 MED ORDER — IBUPROFEN 800 MG PO TABS: 800.0000 mg | ORAL_TABLET | Freq: Three times a day (TID) | ORAL | 0 refills | Status: AC | PRN

## 2022-05-03 MED ORDER — SUGAMMADEX SODIUM 200 MG/2ML IV SOLN
INTRAVENOUS | Status: DC | PRN
  Administered 2022-05-03: 200 mg via INTRAVENOUS

## 2022-05-03 MED ORDER — FENTANYL CITRATE (PF) 100 MCG/2ML IJ SOLN
INTRAMUSCULAR | Status: DC | PRN
Start: 2022-05-03 — End: 2022-05-03
  Administered 2022-05-03 (×2): 50 ug via INTRAVENOUS

## 2022-05-03 MED ORDER — CELECOXIB 200 MG PO CAPS
ORAL_CAPSULE | ORAL | Status: AC
  Administered 2022-05-03: 200 mg via ORAL
  Filled 2022-05-03: qty 1

## 2022-05-03 MED ORDER — CHLORHEXIDINE GLUCONATE 0.12 % MT SOLN
OROMUCOSAL | Status: AC
  Administered 2022-05-03: 15 mL via OROMUCOSAL
  Filled 2022-05-03: qty 15

## 2022-05-03 MED ORDER — PHENYLEPHRINE HCL-NACL 20-0.9 MG/250ML-% IV SOLN
INTRAVENOUS | Status: DC | PRN
  Administered 2022-05-03: 40 ug/min via INTRAVENOUS

## 2022-05-03 MED ORDER — OXYCODONE HCL 5 MG/5ML PO SOLN: 5.0000 mg | Freq: Once | ORAL | Status: AC | PRN

## 2022-05-03 MED ORDER — PHENYLEPHRINE HCL (PRESSORS) 10 MG/ML IV SOLN
INTRAVENOUS | Status: DC | PRN
  Administered 2022-05-03: 80 ug via INTRAVENOUS
  Administered 2022-05-03 (×3): 160 ug via INTRAVENOUS

## 2022-05-03 MED ORDER — FENTANYL CITRATE (PF) 100 MCG/2ML IJ SOLN
25.0000 ug | INTRAMUSCULAR | Status: DC | PRN
  Administered 2022-05-03: 25 ug via INTRAVENOUS

## 2022-05-03 MED ORDER — ACETAMINOPHEN 500 MG PO TABS
ORAL_TABLET | ORAL | Status: AC
  Administered 2022-05-03: 1000 mg via ORAL
  Filled 2022-05-03: qty 2

## 2022-05-03 MED ORDER — BUPIVACAINE-EPINEPHRINE (PF) 0.25% -1:200000 IJ SOLN
INTRAMUSCULAR | Status: DC | PRN
  Administered 2022-05-03: 30 mL

## 2022-05-03 MED ORDER — ROCURONIUM BROMIDE 100 MG/10ML IV SOLN
INTRAVENOUS | Status: DC | PRN
  Administered 2022-05-03: 70 mg via INTRAVENOUS

## 2022-05-03 MED ORDER — GABAPENTIN 300 MG PO CAPS
ORAL_CAPSULE | ORAL | Status: AC
  Administered 2022-05-03: 300 mg via ORAL
  Filled 2022-05-03: qty 1

## 2022-05-03 MED ORDER — BUPIVACAINE-EPINEPHRINE (PF) 0.25% -1:200000 IJ SOLN
INTRAMUSCULAR | Status: AC
  Filled 2022-05-03: qty 30

## 2022-05-03 SURGICAL SUPPLY — 47 items
ADH SKN CLS APL DERMABOND .7 (GAUZE/BANDAGES/DRESSINGS) ×1
BAG PRESSURE INF REUSE 3000 (BAG) IMPLANT
CLIP LIGATING HEM O LOK PURPLE (MISCELLANEOUS) ×2 IMPLANT
COVER TIP SHEARS 8 DVNC (MISCELLANEOUS) ×1 IMPLANT
COVER TIP SHEARS 8MM DA VINCI (MISCELLANEOUS) ×2
DERMABOND ADVANCED (GAUZE/BANDAGES/DRESSINGS) ×1
DERMABOND ADVANCED .7 DNX12 (GAUZE/BANDAGES/DRESSINGS) ×1 IMPLANT
DRAPE ARM DVNC X/XI (DISPOSABLE) ×4 IMPLANT
DRAPE COLUMN DVNC XI (DISPOSABLE) ×1 IMPLANT
DRAPE DA VINCI XI ARM (DISPOSABLE) ×8
DRAPE DA VINCI XI COLUMN (DISPOSABLE) ×2
ELECT CAUTERY BLADE 6.4 (BLADE) ×2 IMPLANT
GLOVE ORTHO TXT STRL SZ7.5 (GLOVE) ×4 IMPLANT
GOWN STRL REUS W/ TWL LRG LVL3 (GOWN DISPOSABLE) ×2 IMPLANT
GOWN STRL REUS W/ TWL XL LVL3 (GOWN DISPOSABLE) ×2 IMPLANT
GOWN STRL REUS W/TWL LRG LVL3 (GOWN DISPOSABLE) ×4
GOWN STRL REUS W/TWL XL LVL3 (GOWN DISPOSABLE) ×4
GRASPER SUT TROCAR 14GX15 (MISCELLANEOUS) IMPLANT
IRRIGATION STRYKERFLOW (MISCELLANEOUS) IMPLANT
IRRIGATOR STRYKERFLOW (MISCELLANEOUS)
IRRIGATOR SUCT 8 DISP DVNC XI (IRRIGATION / IRRIGATOR) IMPLANT
IRRIGATOR SUCTION 8MM XI DISP (IRRIGATION / IRRIGATOR)
IV NS IRRIG 3000ML ARTHROMATIC (IV SOLUTION) IMPLANT
KIT PINK PAD W/HEAD ARE REST (MISCELLANEOUS) ×2
KIT PINK PAD W/HEAD ARM REST (MISCELLANEOUS) ×1 IMPLANT
KIT TURNOVER KIT A (KITS) ×2 IMPLANT
LABEL OR SOLS (LABEL) ×2 IMPLANT
MANIFOLD NEPTUNE II (INSTRUMENTS) ×2 IMPLANT
NDL INSUFFLATION 14GA 120MM (NEEDLE) IMPLANT
NEEDLE HYPO 22GX1.5 SAFETY (NEEDLE) ×2 IMPLANT
NEEDLE INSUFFLATION 14GA 120MM (NEEDLE) IMPLANT
NS IRRIG 500ML POUR BTL (IV SOLUTION) ×2 IMPLANT
PACK LAP CHOLECYSTECTOMY (MISCELLANEOUS) ×2 IMPLANT
SCISSORS METZENBAUM CVD 33 (INSTRUMENTS) ×1 IMPLANT
SEAL CANN UNIV 5-8 DVNC XI (MISCELLANEOUS) ×4 IMPLANT
SEAL XI 5MM-8MM UNIVERSAL (MISCELLANEOUS) ×8
SET TUBE SMOKE EVAC HIGH FLOW (TUBING) ×2 IMPLANT
SOLUTION ELECTROLUBE (MISCELLANEOUS) ×2 IMPLANT
SPIKE FLUID TRANSFER (MISCELLANEOUS) ×2 IMPLANT
SUT MNCRL 4-0 (SUTURE) ×2
SUT MNCRL 4-0 27XMFL (SUTURE) ×1
SUT VICRYL 0 AB UR-6 (SUTURE) ×2 IMPLANT
SUTURE MNCRL 4-0 27XMF (SUTURE) ×1 IMPLANT
SYS BAG RETRIEVAL 10MM (BASKET) ×2
SYSTEM BAG RETRIEVAL 10MM (BASKET) ×1 IMPLANT
TROCAR Z-THREAD FIOS 11X100 BL (TROCAR) IMPLANT
WATER STERILE IRR 500ML POUR (IV SOLUTION) ×2 IMPLANT

## 2022-05-03 NOTE — Op Note (Signed)
Robotic cholecystectomy with Indocyamine Green Ductal Imaging.   Pre-operative Diagnosis: Chronic calculus cholecystitis  Post-operative Diagnosis:  Same.  Procedure: Robotic assisted laparoscopic cholecystectomy with Indocyamine Green Ductal Imaging.   Surgeon: Ronny Bacon, M.D., FACS  Anesthesia: General. with endotracheal tube  Findings: enlarged fatty infiltrated liver   Estimated Blood Loss: 15 mL         Drains: None         Specimens: Gallbladder           Complications: none  Procedure Details  The patient was seen again in the Holding Room.  1.25 mg dose of ICG was administered intravenously.   The benefits, complications, treatment options, risks and expected outcomes were again reviewed with the patient. The likelihood of improving the patient's symptoms with return to their baseline status is good.  The patient and/or family concurred with the proposed plan, giving informed consent, again alternatives reviewed.  The patient was taken to Operating Room, identified, and the procedure verified as robotic assisted laparoscopic cholecystectomy.  Prior to the induction of general anesthesia, antibiotic prophylaxis was administered. VTE prophylaxis was in place. General endotracheal anesthesia was then administered and tolerated well. The patient was positioned in the supine position.  After the induction, the abdomen was prepped with Chloraprep and draped in the sterile fashion.  A Time Out was held and the above information confirmed.  After local infiltration of quarter percent Marcaine with epinephrine, stab incision was made left upper quadrant.  Just below the costal margin at Palmer's point, approximately midclavicular line the Veres needle is passed with sensation of the layers to penetrate the abdominal wall and into the peritoneum.  Saline drop test is confirmed peritoneal placement.  Insufflation is initiated with carbon dioxide to pressures of 15 mmHg.  Right  infra-umbilical local infiltration with quarter percent Marcaine with epinephrine is utilized.  Made a 12 mm incision on the right periumbilical site, I advanced an optical 50m port under direct visualization into the peritoneal cavity.  Once the peritoneum was penetrated, insufflation was initiated.  The trocar was then advanced into the abdominal cavity under direct visualization. Pneumoperitoneum was then continued with Air seal utilizing CO2 at 15 mmHg or less and tolerated well without any adverse changes in the patient's vital signs.  Two 8.5-mm ports were placed in the left lower quadrant and laterally, and one to the right lower quadrant, all under direct vision. All skin incisions  were infiltrated with a local anesthetic agent before making the incision and placing the trocars.  I noted an adhesion, initially appearing c/w SB at a port site, I began to ensure this was not an incarcerated knuckle of bowel, and identified this was fallopian tube with a similar adhesion in the LLQ.  The patient was positioned  in reverse Trendelenburg, tilted the patient's left side down.  Da Vinci XI robot was then positioned on to the patient's left side, and docked.  The gallbladder was identified, the fundus grasped via the arm 4 Prograsp and retracted cephalad. Adhesions were lysed with scissors and cautery. With some difficulty, due to the deep cleft for the GB, and the fatty infiltration of the liver, the infundibulum was identified grasped and retracted, exposing the peritoneum overlying the triangle of Calot. This was then opened and dissected using cautery & scissors. An extended critical view of the cystic duct and cystic artery was obtained, aided by the ICG via FireFly which enabled ready visualization of the ductal anatomy.    The  cystic duct was clearly identified and dissected to isolation.   Artery well isolated and clipped, and the cystic duct was triple clipped and divided with scissors, as close to  the gallbladder neck as feasible, thus leaving two on the remaining stump.  The specimen side of the artery is sealed with bipolar and divided with monopolar scissors.   The gallbladder was taken from the gallbladder fossa in a retrograde fashion with the electrocautery. The gallbladder was removed and placed in an Endocatch bag.  The liver bed is inspected. Hemostasis was confirmed.  The robot was undocked and moved away from the operative field. No irrigation was utilized. The gallbladder and Endocatch sac were then removed through the infraumbilical port site.   Inspection of the right upper quadrant was performed. No bleeding, bile duct injury or leak, or bowel injury was noted. The infra-umbilical port site fascia was closed with interrumpted 0 Vicryl sutures using PMI/cone under direct visualization. Pneumoperitoneum was released and ports removed.  4-0 subcuticular Monocryl was used to close the skin. Dermabond was  applied.  The patient was then extubated and brought to the recovery room in stable condition. Sponge, lap, and needle counts were correct at closure and at the conclusion of the case.               Ronny Bacon, M.D., [facility] 05/03/2022 4:40 PM

## 2022-05-03 NOTE — Interval H&P Note (Signed)
History and Physical Interval Note:  05/03/2022 12:45 PM  Patricia Caldwell  has presented today for surgery, with the diagnosis of cholelithiasis.  The various methods of treatment have been discussed with the patient and family. After consideration of risks, benefits and other options for treatment, the patient has consented to  Procedure(s): XI ROBOTIC ASSISTED LAPAROSCOPIC CHOLECYSTECTOMY (N/A) [place] (ICG) (N/A) as a surgical intervention.  The patient's history has been reviewed, patient examined, no change in status, stable for surgery.  I have reviewed the patient's chart and labs.  Questions were answered to the patient's satisfaction.     Ronny Bacon

## 2022-05-03 NOTE — Anesthesia Postprocedure Evaluation (Signed)
Anesthesia Post Note  Patient: LUDY MESSAMORE  Procedure(s) Performed: XI ROBOTIC ASSISTED LAPAROSCOPIC CHOLECYSTECTOMY (Abdomen) INDOCYANINE GREEN FLUORESCENCE IMAGING (ICG) (Abdomen)  Patient location during evaluation: PACU Anesthesia Type: General Level of consciousness: awake and alert, oriented and patient cooperative Pain management: pain level controlled Vital Signs Assessment: post-procedure vital signs reviewed and stable Respiratory status: spontaneous breathing, nonlabored ventilation and respiratory function stable Cardiovascular status: blood pressure returned to baseline and stable Postop Assessment: adequate PO intake Anesthetic complications: no   No notable events documented.   Last Vitals:  Vitals:   05/03/22 1715 05/03/22 1719  BP: 106/61 106/61  Pulse: 70 73  Resp: 15 16  Temp: (!) 36.3 C (!) 36.1 C  SpO2: 93% 92%    Last Pain:  Vitals:   05/03/22 1719  TempSrc: Temporal  PainSc:                  Darrin Nipper

## 2022-05-03 NOTE — Anesthesia Procedure Notes (Signed)
Procedure Name: Intubation Date/Time: 05/03/2022 3:11 PM  Performed by: Beverely Low, CRNAPre-anesthesia Checklist: Patient identified, Patient being monitored, Timeout performed, Emergency Drugs available and Suction available Patient Re-evaluated:Patient Re-evaluated prior to induction Oxygen Delivery Method: Circle system utilized Preoxygenation: Pre-oxygenation with 100% oxygen Induction Type: IV induction Ventilation: Mask ventilation without difficulty Laryngoscope Size: McGraph and 3 Grade View: Grade I Tube type: Oral Tube size: 7.0 mm Number of attempts: 1 Airway Equipment and Method: Stylet Placement Confirmation: ETT inserted through vocal cords under direct vision, positive ETCO2 and breath sounds checked- equal and bilateral Secured at: 21 cm Tube secured with: Tape Dental Injury: Teeth and Oropharynx as per pre-operative assessment

## 2022-05-03 NOTE — Discharge Instructions (Signed)
AMBULATORY SURGERY  ?DISCHARGE INSTRUCTIONS ? ? ?The drugs that you were given will stay in your system until tomorrow so for the next 24 hours you should not: ? ?Drive an automobile ?Make any legal decisions ?Drink any alcoholic beverage ? ? ?You may resume regular meals tomorrow.  Today it is better to start with liquids and gradually work up to solid foods. ? ?You may eat anything you prefer, but it is better to start with liquids, then soup and crackers, and gradually work up to solid foods. ? ? ?Please notify your doctor immediately if you have any unusual bleeding, trouble breathing, redness and pain at the surgery site, drainage, fever, or pain not relieved by medication. ? ? ? ?Additional Instructions: ? ? ? ?Please contact your physician with any problems or Same Day Surgery at [phone], Monday through Friday 6 am to 4 pm, or [place] at [employer] Main number at [phone].  ?

## 2022-05-03 NOTE — Transfer of Care (Addendum)
Immediate Anesthesia Transfer of Care Note  Patient: Patricia Caldwell  Procedure(s) Performed: XI ROBOTIC ASSISTED LAPAROSCOPIC CHOLECYSTECTOMY INDOCYANINE GREEN FLUORESCENCE IMAGING (ICG)  Patient Location: PACU  Anesthesia Type:General  Level of Consciousness: drowsy  Airway & Oxygen Therapy: Patient Spontanous Breathing and Patient connected to face mask oxygen  Post-op Assessment: Report given to RN and Post -op Vital signs reviewed and stable  Post vital signs: Reviewed and stable  Last Vitals:  Vitals Value Taken Time  BP    Temp    Pulse    Resp    SpO2      Last Pain:  Vitals:   05/03/22 1233  TempSrc: Temporal  PainSc: 0-No pain         Complications: No notable events documented.

## 2022-05-03 NOTE — Anesthesia Preprocedure Evaluation (Signed)
Anesthesia Evaluation  Patient identified by MRN, date of birth, ID band Patient awake    Reviewed: Allergy & Precautions, NPO status , Patient's Chart, lab work & pertinent test results  History of Anesthesia Complications Negative for: history of anesthetic complications  Airway Mallampati: III  TM Distance: <3 FB Neck ROM: full    Dental  (+) Chipped   Pulmonary neg shortness of breath, former smoker,    Pulmonary exam normal        Cardiovascular Exercise Tolerance: Good hypertension, (-) angina(-) Past MI and (-) DOE Normal cardiovascular exam     Neuro/Psych PSYCHIATRIC DISORDERS negative neurological ROS     GI/Hepatic Neg liver ROS, GERD  Controlled,  Endo/Other  diabetes, Type 2  Renal/GU      Musculoskeletal   Abdominal   Peds  Hematology negative hematology ROS (+)   Anesthesia Other Findings Past Medical History: No date: Arthritis     Comment:  knee left No date: Cancer ([place])     Comment:  basal cell No date: Depression No date: Diabetes mellitus without complication (HCC) No date: Endometriosis No date: GERD (gastroesophageal reflux disease) No date: Hypertension  Past Surgical History: 01/27/2022: BREAST BIOPSY; Right     Comment:  stereo bx-calcs "X" clip-path pending No date: DIAGNOSTIC LAPAROSCOPY     Comment:  x 3 06/16/2012: DILATION AND CURETTAGE OF UTERUS     Comment:  removal of IUD  BMI    Body Mass Index: 34.95 kg/m      Reproductive/Obstetrics negative OB ROS                             Anesthesia Physical Anesthesia Plan  ASA: 3  Anesthesia Plan: General ETT   Post-op Pain Management:    Induction: Intravenous  PONV Risk Score and Plan: Ondansetron, Dexamethasone, Midazolam and Treatment may vary due to age or medical condition  Airway Management Planned: Oral ETT  Additional Equipment:   Intra-op Plan:   Post-operative Plan:  Extubation in OR  Informed Consent: I have reviewed the patients History and Physical, chart, labs and discussed the procedure including the risks, benefits and alternatives for the proposed anesthesia with the patient or authorized representative who has indicated his/her understanding and acceptance.     Dental Advisory Given  Plan Discussed with: Anesthesiologist, CRNA and Surgeon  Anesthesia Plan Comments: (Patient consented for risks of anesthesia including but not limited to:  - adverse reactions to medications - damage to eyes, teeth, lips or other oral mucosa - nerve damage due to positioning  - sore throat or hoarseness - Damage to heart, brain, nerves, lungs, other parts of body or loss of life  Patient voiced understanding.)        Anesthesia Quick Evaluation

## 2022-05-05 LAB — SURGICAL PATHOLOGY

## 2022-06-07 DIAGNOSIS — M542 Cervicalgia: Secondary | ICD-10-CM | POA: Diagnosis not present

## 2022-07-01 DIAGNOSIS — M542 Cervicalgia: Secondary | ICD-10-CM | POA: Diagnosis not present

## 2022-09-17 DIAGNOSIS — E782 Mixed hyperlipidemia: Secondary | ICD-10-CM | POA: Diagnosis not present

## 2022-09-17 DIAGNOSIS — E1169 Type 2 diabetes mellitus with other specified complication: Secondary | ICD-10-CM | POA: Diagnosis not present

## 2022-09-17 DIAGNOSIS — R5383 Other fatigue: Secondary | ICD-10-CM | POA: Diagnosis not present

## 2022-09-17 DIAGNOSIS — E559 Vitamin D deficiency, unspecified: Secondary | ICD-10-CM | POA: Diagnosis not present

## 2022-12-24 DIAGNOSIS — Z1211 Encounter for screening for malignant neoplasm of colon: Secondary | ICD-10-CM | POA: Diagnosis not present

## 2022-12-24 DIAGNOSIS — Z1212 Encounter for screening for malignant neoplasm of rectum: Secondary | ICD-10-CM | POA: Diagnosis not present

## 2022-12-29 DIAGNOSIS — G250 Essential tremor: Secondary | ICD-10-CM | POA: Diagnosis not present

## 2022-12-29 DIAGNOSIS — E559 Vitamin D deficiency, unspecified: Secondary | ICD-10-CM | POA: Diagnosis not present

## 2022-12-29 DIAGNOSIS — E1169 Type 2 diabetes mellitus with other specified complication: Secondary | ICD-10-CM | POA: Diagnosis not present

## 2022-12-29 DIAGNOSIS — I10 Essential (primary) hypertension: Secondary | ICD-10-CM | POA: Diagnosis not present

## 2022-12-30 ENCOUNTER — Encounter: Payer: Self-pay | Admitting: Neurology

## 2023-02-07 NOTE — Progress Notes (Unsigned)
Assessment/Plan:   1.  Essential Tremor.  -This is evidenced by the symmetrical nature and longstanding hx of gradually getting worse.  We discussed nature and pathophysiology.  We discussed that this can continue to gradually get worse with time.  We discussed that some medications can worsen this, as can caffeine use.  We discussed medication therapy as well as surgical therapy.  Ultimately, the patient decided to ***.     Subjective:   Patricia Caldwell was seen in consultation in the movement disorder clinic at the request of Ziglar, Eli Phillips, MD.  The evaluation is for tremor.  Tremor started approximately *** 2 years ago and involves the head.  Tremor is most noticeable when ***.   There is *** family hx of tremor.    Affected by caffeine:  {yes no:314532} Affected by alcohol:  {yes no:314532} Affected by stress:  {yes no:314532} Affected by fatigue:  {yes no:314532} Spills soup if on spoon:  {yes no:314532} Spills glass of liquid if full:  {yes no:314532} Affects ADL's (tying shoes, brushing teeth, etc):  {yes no:314532}  Current/Previously tried tremor medications: ***Propranolol  Current medications that may exacerbate tremor:  ***vraylar  Outside reports reviewed: {Outside review:15817}.  No Known Allergies  No outpatient medications have been marked as taking for the 02/10/23 encounter (Appointment) with Tracie Dore, Octaviano Batty, DO.      Objective:   VITALS:  There were no vitals filed for this visit. Gen:  Appears stated age and in NAD. HEENT:  Normocephalic, atraumatic. The mucous membranes are moist. The superficial temporal arteries are without ropiness or tenderness. Cardiovascular: Regular rate and rhythm. Lungs: Clear to auscultation bilaterally. Neck: There are no carotid bruits noted bilaterally.  NEUROLOGICAL:  Orientation:  The patient is alert and oriented x 3.   Cranial nerves: There is good facial symmetry. Extraocular muscles are intact and visual fields  are full to confrontational testing. Speech is fluent and clear. Soft palate rises symmetrically and there is no tongue deviation. Hearing is intact to conversational tone. Tone: Tone is good throughout. Sensation: Sensation is intact to light touch touch throughout (facial, trunk, extremities). Vibration is intact at the bilateral big toe. There is no extinction with double simultaneous stimulation. There is no sensory dermatomal level identified. Coordination:  The patient has no dysdiadichokinesia or dysmetria. Motor: Strength is 5/5 in the bilateral upper and lower extremities.  Shoulder shrug is equal bilaterally.  There is no pronator drift.  There are no fasciculations noted. DTR's: Deep tendon reflexes are 2/4 at the bilateral biceps, triceps, brachioradialis, patella and achilles.  Plantar responses are downgoing bilaterally. Gait and Station: The patient is able to ambulate without difficulty. The patient is able to heel toe walk without any difficulty. The patient is able to ambulate in a tandem fashion. The patient is able to stand in the Romberg position.   MOVEMENT EXAM: Tremor:  There is *** tremor in the UE, noted most significantly with action.  The patient is *** able to draw Archimedes spirals without significant difficulty.  There is *** tremor at rest.  The patient is *** able to pour water from one glass to another without spilling it.  I have reviewed and interpreted the following labs independently   Chemistry      Component Value Date/Time   NA 140 04/10/2022 1946   NA 138 10/17/2012 1546   K 3.2 (L) 04/10/2022 1946   K 3.6 10/17/2012 1546   CL 102 04/10/2022 1946   CL 105  10/17/2012 1546   CO2 30 04/10/2022 1946   CO2 26 10/17/2012 1546   BUN 18 04/10/2022 1946   BUN 6 (L) 10/17/2012 1546   CREATININE 0.61 04/10/2022 1946   CREATININE 0.59 (L) 10/17/2012 1546      Component Value Date/Time   CALCIUM 8.8 (L) 04/10/2022 1946   CALCIUM 8.5 10/17/2012 1546    ALKPHOS 125 04/10/2022 1946   ALKPHOS 125 10/17/2012 1546   AST 117 (H) 04/10/2022 1946   AST 60 (H) 10/17/2012 1546   ALT 70 (H) 04/10/2022 1946   ALT 150 (H) 10/17/2012 1546   BILITOT 0.7 04/10/2022 1946   BILITOT 0.5 10/17/2012 1546      Lab Results  Component Value Date   WBC 10.7 (H) 04/10/2022   HGB 14.4 04/10/2022   HCT 42.8 04/10/2022   MCV 85.1 04/10/2022   PLT 358 04/10/2022   No results found for: "TSH" (in our system or in labs sent by pcp)  She had lab work through primary care and her B12 was 594 on October 15, 2021. Total time spent on today's visit was ***60 minutes, including both face-to-face time and nonface-to-face time.  Time included that spent on review of records (prior notes available to me/labs/imaging if pertinent), discussing treatment and goals, answering patient's questions and coordinating care.  CC:  Ziglar, Eli Phillips, MD

## 2023-02-10 ENCOUNTER — Encounter: Payer: Self-pay | Admitting: Neurology

## 2023-02-10 ENCOUNTER — Ambulatory Visit: Payer: BC Managed Care – PPO | Admitting: Neurology

## 2023-02-10 VITALS — BP 122/84 | HR 81 | Ht 65.0 in | Wt 214.0 lb

## 2023-02-10 DIAGNOSIS — G243 Spasmodic torticollis: Secondary | ICD-10-CM

## 2023-02-10 NOTE — Patient Instructions (Signed)
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  We discussed treatment options and decided to Xeomin, a form of botox treatment.  We will try to get that approved via insurance.  The physicians and staff at [facility] are committed to providing excellent care. You may receive a survey requesting feedback about your experience at our office. We strive to receive "very good" responses to the survey questions. If you feel that your experience would prevent you from giving the office a "very good " response, please contact our office to try to remedy the situation. We may be reached at [phone]. Thank you for taking the time out of your busy day to complete the survey.

## 2023-02-11 ENCOUNTER — Telehealth: Payer: Self-pay | Admitting: Pharmacy Technician

## 2023-02-11 ENCOUNTER — Other Ambulatory Visit (HOSPITAL_COMMUNITY): Payer: Self-pay

## 2023-02-11 NOTE — Telephone Encounter (Unsigned)
Patient Advocate Encounter  Received notification from [facility] that prior authorization for XEOMIN 200U is required.   PA submitted on 5.17.24 Key [id number] Status is pending

## 2023-02-14 NOTE — Telephone Encounter (Signed)
PA has been APPROVED from 02/14/2023-01/13/2024

## 2023-02-23 IMAGING — MG MM BREAST LOCALIZATION CLIP
4 series · 4 of 12 positions shown · non-contrast
Comparison: Previous exams

CLINICAL DATA: Status post stereotactic guided biopsy

EXAM:
3D DIAGNOSTIC RIGHT MAMMOGRAM POST STEREOTACTIC BIOPSY

[R CC synth-2D]
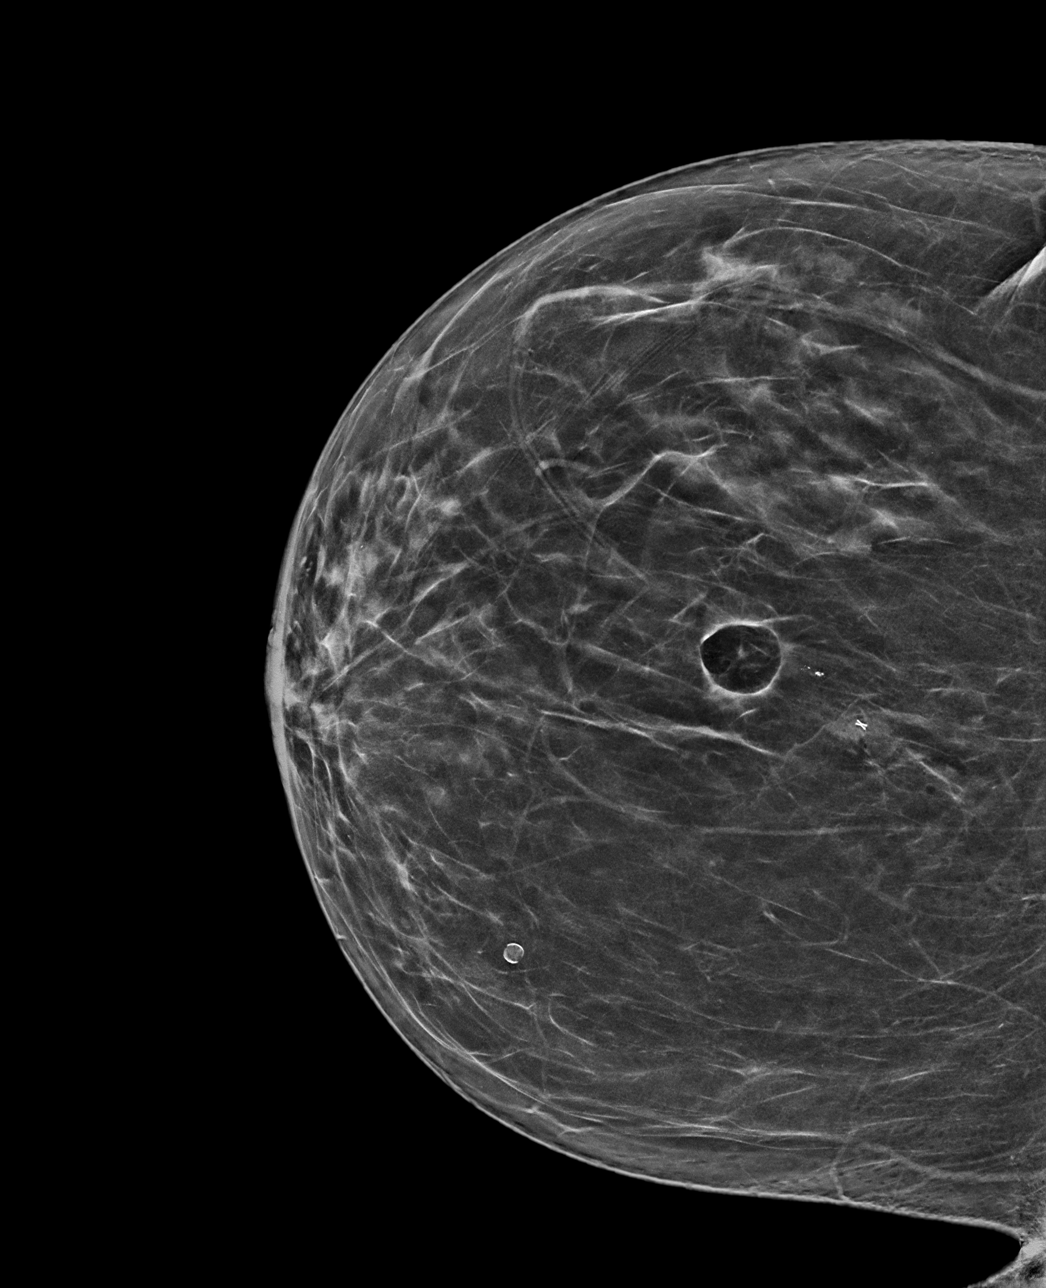

[R ML synth-2D]
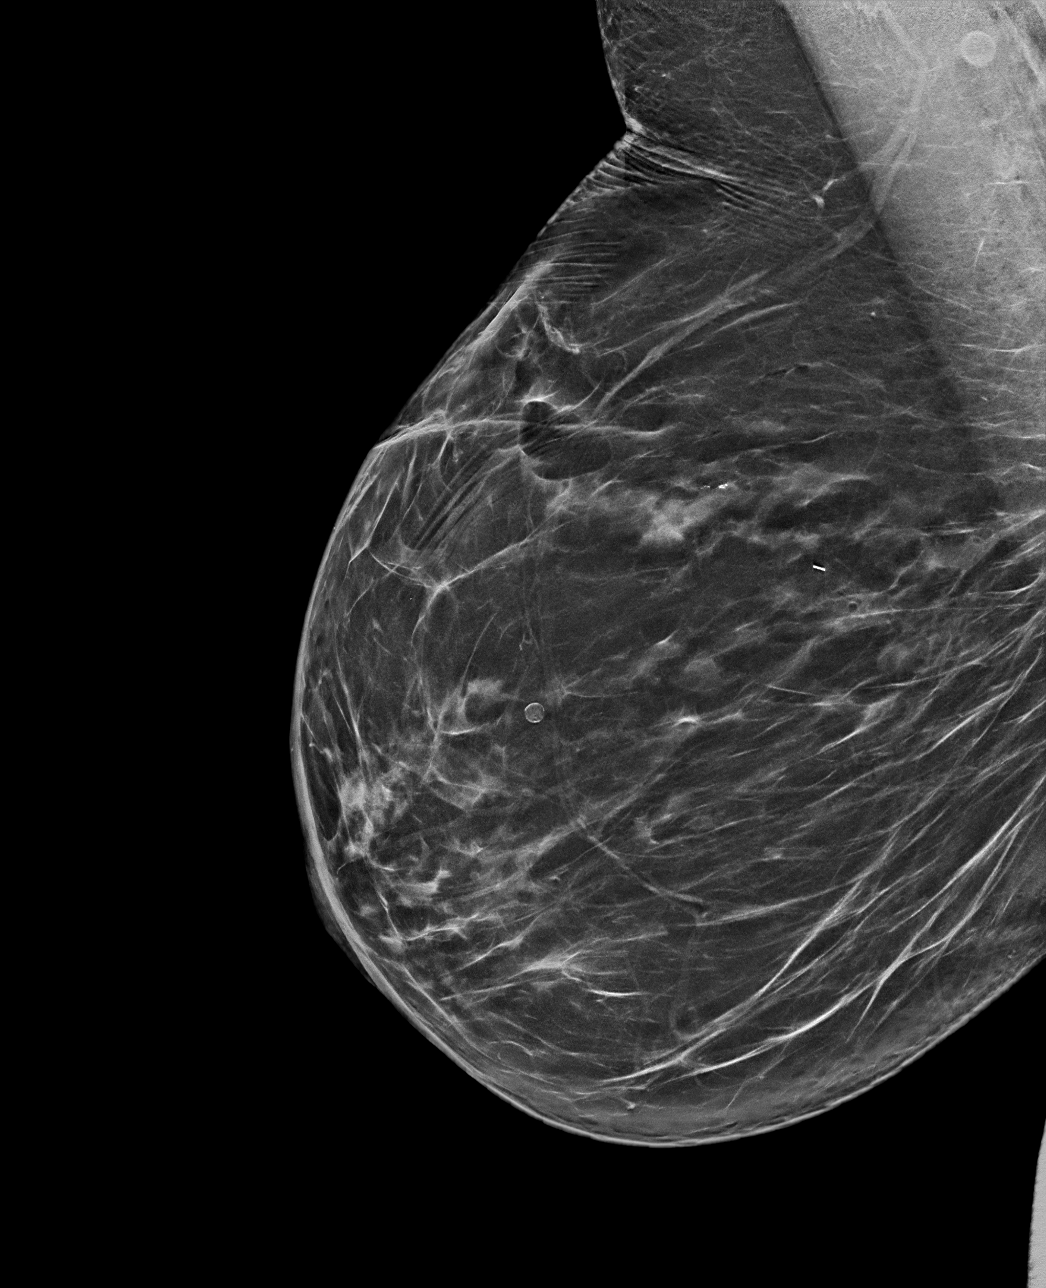

[R CC tomo · tomo slice 34/67.0]
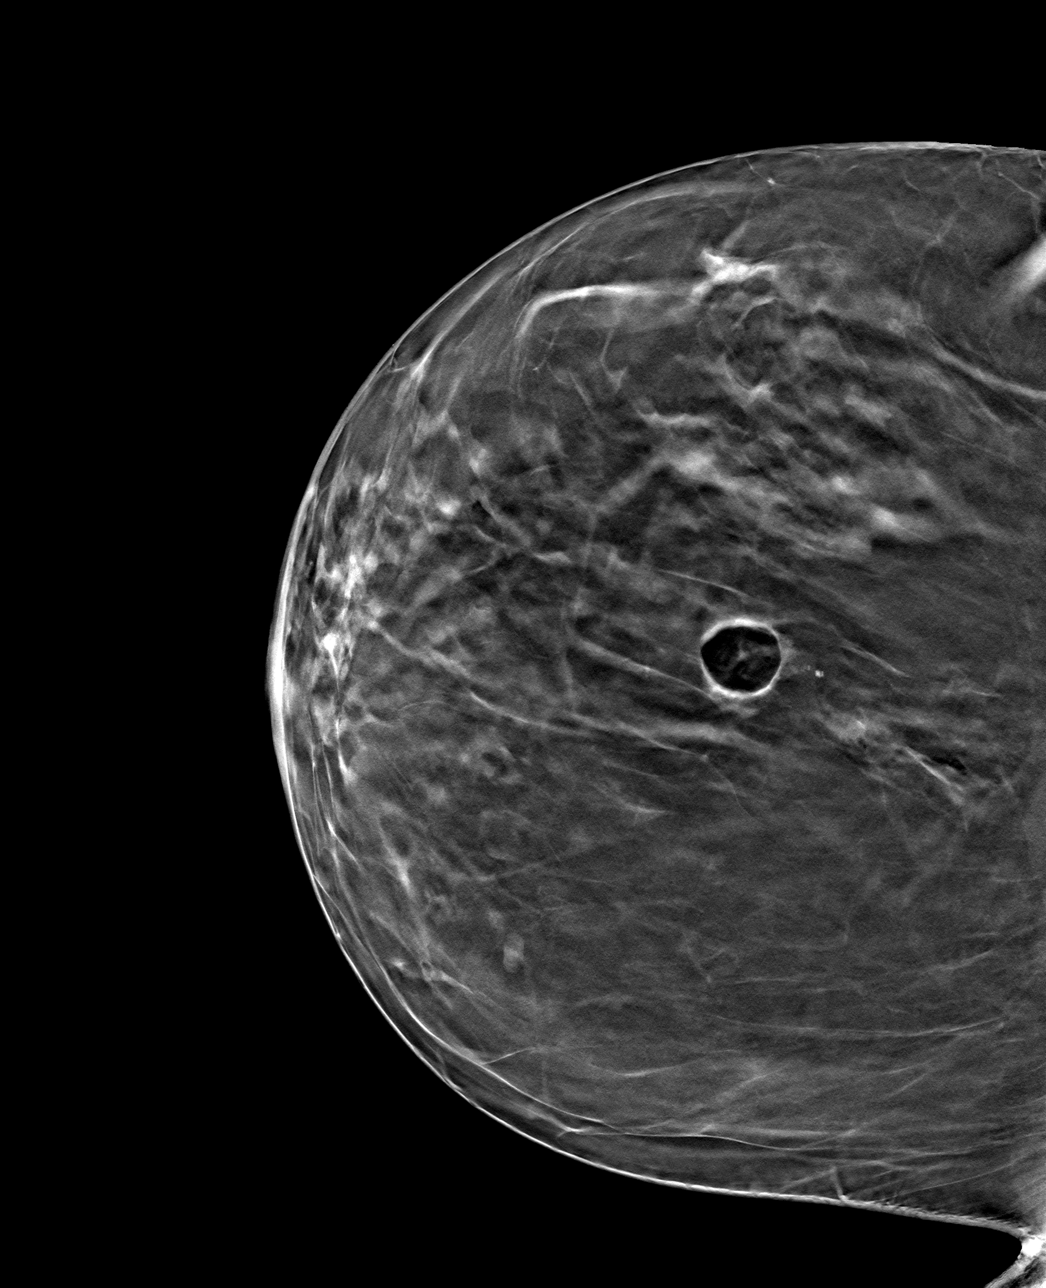

[R ML tomo · tomo slice 41/80.0]
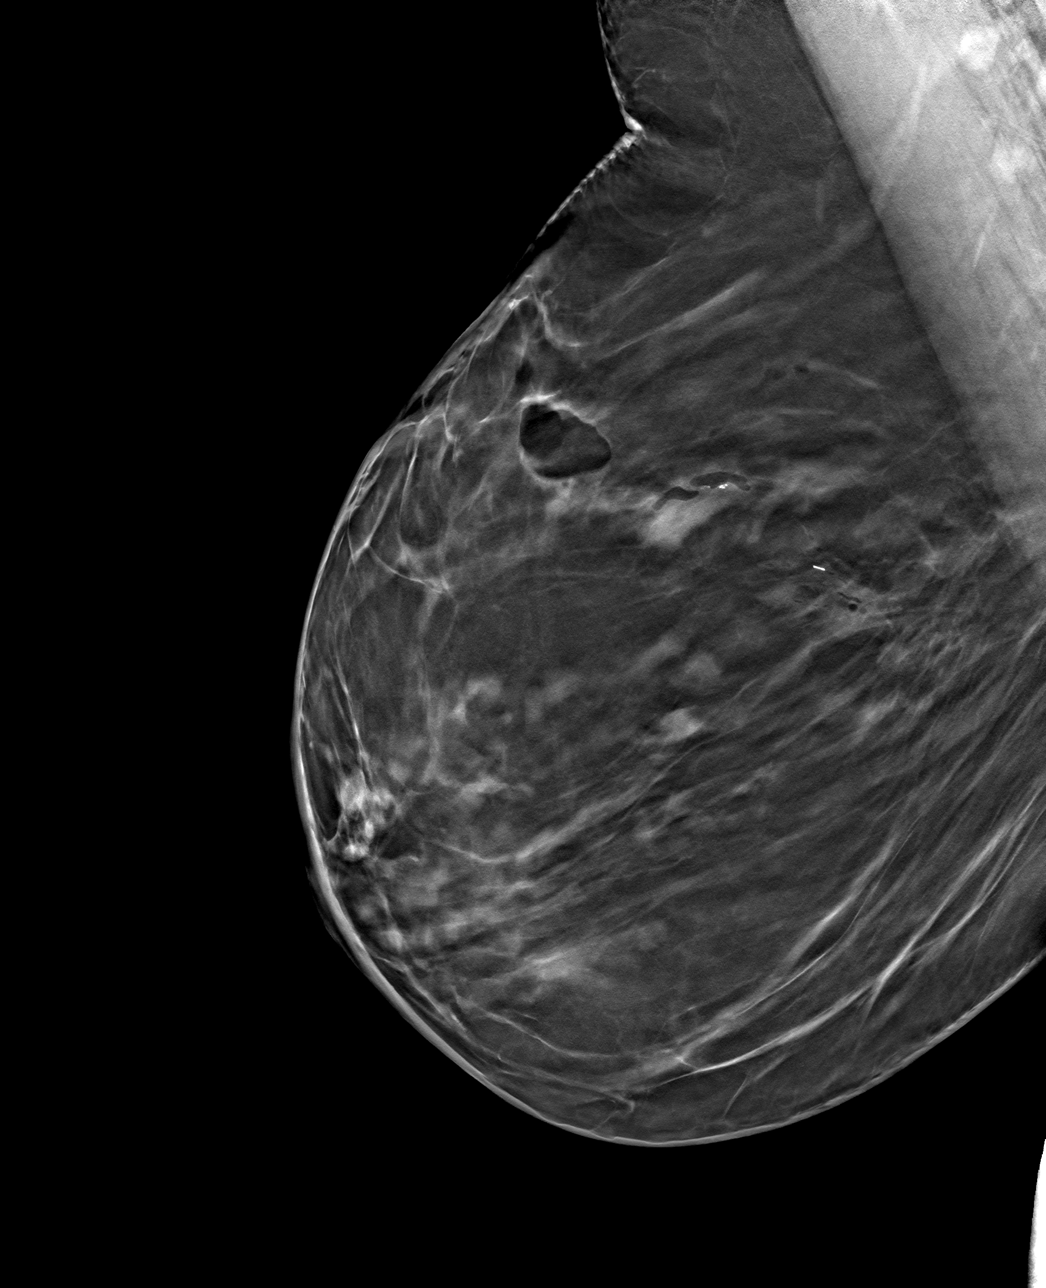

[4 of 12 positions shown; findings below may reference images not displayed]

FINDINGS: 3D Mammographic images were obtained following stereotactic guided
biopsy of the anterior aspect of indeterminate calcifications. The X
shaped biopsy marking clip is displaced from the residual
calcifications (posterior group) predominantly inferiorly and
posteriorly. It is inferiorly and posteriorly displaced by
approximately 2.2 cm.
IMPRESSION: Predominately inferior and posterior displacement of X shaped clip
from the residual posterior aspect of calcifications as described
above.

Final Assessment: Post Procedure Mammograms for Marker Placement

## 2023-02-28 ENCOUNTER — Other Ambulatory Visit (HOSPITAL_COMMUNITY): Payer: Self-pay

## 2023-03-01 ENCOUNTER — Other Ambulatory Visit (HOSPITAL_COMMUNITY): Payer: Self-pay

## 2023-03-11 ENCOUNTER — Ambulatory Visit: Payer: BC Managed Care – PPO | Admitting: Neurology

## 2023-03-11 DIAGNOSIS — G243 Spasmodic torticollis: Secondary | ICD-10-CM | POA: Diagnosis not present

## 2023-03-11 MED ORDER — INCOBOTULINUMTOXINA 100 UNITS IM SOLR
200.0000 [IU] | INTRAMUSCULAR | Status: AC
  Administered 2023-03-11: 200 [IU] via INTRAMUSCULAR

## 2023-03-11 NOTE — Procedures (Signed)
Botulinum Clinic   Procedure Note Botox  Attending: Dr. Lurena Joiner Rylee Nuzum  Preoperative Diagnosis(es): Cervical Dystonia  Result History  Onset of effect: n/a  Duration of Benefit: n/ a Adverse Effects: n/a   Consent obtained from: The patient Benefits discussed included, but were not limited to decreased muscle tightness, increased joint range of motion, and decreased pain.  Risk discussed included, but were not limited pain and discomfort, bleeding, bruising, excessive weakness, venous thrombosis, muscle atrophy and dysphagia.  A copy of the patient medication guide was given to the patient which explains the blackbox warning.  Patients identity and treatment sites confirmed Yes.  .  Details of Procedure: Skin was cleaned with alcohol.  A 30 gauge, 25mm  needle was introduced to the target muscle, except for posterior splenius where 27 gauge, 1.5 inch needle used.   Prior to injection, the needle plunger was aspirated to make sure the needle was not within a blood vessel.  There was no blood retrieved on aspiration.    Following is a summary of the muscles injected  And the amount of Botulinum toxin (Xeomin) used:   Dilution 0.9% preservative free saline mixed with 100 u Botox type A to make 10 U per 0.1cc  Injections  Location Left  Right Units Number of sites        Sternocleidomastoid  50 50 1  Splenius Capitus, posterior approach 70  70 1  Splenius Capitus, lateral approach 30  30 1   Levator Scapulae      Trapezius 20/10 20 50         TOTAL UNITS:   200    Agent: Incobotunlinumtoxina.  2 vials of Botox were used, each containing 100 units and freshly diluted with 1 mL of sterile, non-preserved saline   Total injected (Units): 200  Total wasted (Units): none wasted   Pt tolerated procedure well without complications.   Reinjection is anticipated in 3 months.

## 2023-04-18 NOTE — Progress Notes (Unsigned)
   Assessment/Plan:   Cervical dystonia  -did really well with first dose of Xeomin.  We will continue 200 U of xeomin.  I gave her Xeomin savings card   Subjective:   Patricia Caldwell was seen today in follow up for cervical dystonia.  Patient's first injections of Xeomin were on March 11, 2023.  She reports that it took about 10 days to kick in.  "I've noticed it some but its nothing like it was.  Its done wonders."  No side effects.    CURRENT MEDICATIONS:  Outpatient Encounter Medications as of 04/19/2023  Medication Sig   atorvastatin (LIPITOR) 10 MG tablet Take 10 mg by mouth daily.   Cholecalciferol (VITAMIN D3) 125 MCG (5000 UT) CAPS Take 1 capsule by mouth every other day.   esomeprazole (NEXIUM) 40 MG capsule Take 40 mg by mouth daily.   FLUoxetine (PROZAC) 20 MG capsule Take 60 mg by mouth every morning.   ibuprofen (ADVIL) 800 MG tablet Take 1 tablet (800 mg total) by mouth every 8 (eight) hours as needed.   JARDIANCE 10 MG TABS tablet Take 10 mg by mouth daily.   propranolol (INDERAL) 10 MG tablet Take 10 mg by mouth daily.   tirzepatide (MOUNJARO) 15 MG/0.5ML Pen Inject 15 mg into the skin once a week. (Patient not taking: Reported on 04/19/2023)   Facility-Administered Encounter Medications as of 04/19/2023  Medication   incobotulinumtoxinA (XEOMIN) 100 units injection 200 Units     Objective:   PHYSICAL EXAMINATION:    VITALS:   Vitals:   04/19/23 1446  BP: 124/62  Pulse: 81  SpO2: 96%  Weight: 224 lb (101.6 kg)  Height: 5\' 5"  (1.651 m)    GEN:  The patient appears stated age and is in NAD. HEENT:  Normocephalic, atraumatic.   Neck/HEME:  There are no carotid bruits bilaterally.  Head is midline.  No head tremor today, even with distraction procedures  Neurological examination:  Orientation: The patient is alert and oriented x3. Cranial nerves: There is good facial symmetry.The speech is fluent and clear. Soft palate rises symmetrically and there is  no tongue deviation. Hearing is intact to conversational tone.    Cc:  Ziglar, Eli Phillips, MD

## 2023-04-19 ENCOUNTER — Other Ambulatory Visit (HOSPITAL_COMMUNITY): Payer: Self-pay

## 2023-04-19 ENCOUNTER — Ambulatory Visit: Payer: BC Managed Care – PPO | Admitting: Neurology

## 2023-04-19 ENCOUNTER — Encounter: Payer: Self-pay | Admitting: Neurology

## 2023-04-19 VITALS — BP 124/62 | HR 81 | Ht 65.0 in | Wt 224.0 lb

## 2023-04-19 DIAGNOSIS — G243 Spasmodic torticollis: Secondary | ICD-10-CM | POA: Diagnosis not present

## 2023-04-19 NOTE — Patient Instructions (Signed)
You look great!  We will see you in October!  The physicians and staff at [facility] are committed to providing excellent care. You may receive a survey requesting feedback about your experience at our office. We strive to receive "very good" responses to the survey questions. If you feel that your experience would prevent you from giving the office a "very good " response, please contact our office to try to remedy the situation. We may be reached at [phone]. Thank you for taking the time out of your busy day to complete the survey.

## 2023-04-26 ENCOUNTER — Other Ambulatory Visit: Payer: Self-pay | Admitting: Family Medicine

## 2023-04-26 DIAGNOSIS — Z1231 Encounter for screening mammogram for malignant neoplasm of breast: Secondary | ICD-10-CM

## 2023-05-26 DIAGNOSIS — H5213 Myopia, bilateral: Secondary | ICD-10-CM | POA: Diagnosis not present

## 2023-05-26 DIAGNOSIS — H524 Presbyopia: Secondary | ICD-10-CM | POA: Diagnosis not present

## 2023-05-26 DIAGNOSIS — H52213 Irregular astigmatism, bilateral: Secondary | ICD-10-CM | POA: Diagnosis not present

## 2023-05-26 DIAGNOSIS — E119 Type 2 diabetes mellitus without complications: Secondary | ICD-10-CM | POA: Diagnosis not present

## 2023-07-12 ENCOUNTER — Encounter: Payer: Self-pay | Admitting: Neurology

## 2023-07-15 ENCOUNTER — Ambulatory Visit: Payer: Self-pay | Admitting: Neurology

## 2023-10-12 ENCOUNTER — Telehealth: Payer: Self-pay | Admitting: Family Medicine

## 2023-10-12 ENCOUNTER — Other Ambulatory Visit: Payer: Self-pay

## 2023-10-12 MED ORDER — FLUOXETINE HCL 20 MG PO CAPS: 60.0000 mg | ORAL_CAPSULE | Freq: Every morning | ORAL | 1 refills | Status: DC

## 2023-10-12 MED ORDER — ESOMEPRAZOLE MAGNESIUM 40 MG PO CPDR: 40.0000 mg | DELAYED_RELEASE_CAPSULE | Freq: Every day | ORAL | 3 refills | Status: DC

## 2023-10-12 NOTE — Telephone Encounter (Signed)
 Pt would like to have Prozac  and Nexium  refilled before visit on 10/26/2023    Appointment Pt set up appt to re-establish care w/pcp

## 2023-10-14 ENCOUNTER — Ambulatory Visit: Payer: BC Managed Care – PPO | Admitting: Neurology

## 2023-10-26 ENCOUNTER — Ambulatory Visit: Payer: BC Managed Care – PPO | Admitting: Family Medicine

## 2023-10-26 ENCOUNTER — Encounter: Payer: Self-pay | Admitting: Family Medicine

## 2023-10-26 ENCOUNTER — Telehealth: Payer: Self-pay

## 2023-10-26 VITALS — BP 120/82 | HR 77 | Temp 97.3°F | Resp 16 | Ht 65.0 in | Wt 233.4 lb

## 2023-10-26 DIAGNOSIS — E118 Type 2 diabetes mellitus with unspecified complications: Secondary | ICD-10-CM | POA: Diagnosis not present

## 2023-10-26 DIAGNOSIS — Z1231 Encounter for screening mammogram for malignant neoplasm of breast: Secondary | ICD-10-CM

## 2023-10-26 DIAGNOSIS — Z7984 Long term (current) use of oral hypoglycemic drugs: Secondary | ICD-10-CM

## 2023-10-26 DIAGNOSIS — Z1211 Encounter for screening for malignant neoplasm of colon: Secondary | ICD-10-CM | POA: Insufficient documentation

## 2023-10-26 DIAGNOSIS — R030 Elevated blood-pressure reading, without diagnosis of hypertension: Secondary | ICD-10-CM | POA: Insufficient documentation

## 2023-10-26 DIAGNOSIS — B354 Tinea corporis: Secondary | ICD-10-CM | POA: Insufficient documentation

## 2023-10-26 MED ORDER — FLUCONAZOLE 150 MG PO TABS: 150.0000 mg | ORAL_TABLET | Freq: Every day | ORAL | 0 refills | Status: AC

## 2023-10-26 MED ORDER — EMPAGLIFLOZIN 25 MG PO TABS: 25.0000 mg | ORAL_TABLET | Freq: Every day | ORAL | 5 refills | Status: AC

## 2023-10-26 MED ORDER — NYSTATIN 100000 UNIT/GM EX POWD: 1.0000 | Freq: Three times a day (TID) | CUTANEOUS | 0 refills | Status: DC

## 2023-10-26 MED ORDER — TIRZEPATIDE 2.5 MG/0.5ML ~~LOC~~ SOAJ: 2.5000 mg | SUBCUTANEOUS | 0 refills | Status: AC

## 2023-10-26 NOTE — Progress Notes (Signed)
Established Patient Office Visit  Subjective   Patient ID: Patricia Caldwell, female    DOB: 02/15/1966  Age: 58 y.o. MRN: [id number]  Chief Complaint  Patient presents with   Establish Care   Medical Management of Chronic Issues    HPI Leiah is a delightful 58 year old with type 2 diabetes, mixed hyperlipidemia, vitamin D deficiency, GERD, depression and involuntary head tremor.    She reports that she was having really severe hip pain to the point that it hurt to walk.  She stopped her atorvastatin and the pain stopped.  Discussed starting Zetia with her today. May consider Repatha.   She has been going to neurology and getting Botox injections in her neck and has stopped her tremor.  The Botox works for about 90 days. She has been out of [facility] for several months and the only thing she is taking for diabetes currently is Jardiance 10 mg daily.  She needs a PA to get her Mounjaro. She saw the dermatologist in September and had no significant findings She is doing well with her depression on Prozac 60 mg a day her PHQ-9 is 0 her GAD-7 is 0 Blood pressure mildly elevated 145/84 she is taking propranolol daily.  She does not sleep very well she wakes up almost hourly but is able to go right back to sleep She is due a mammogram and would like to go to [place] in [place] She did a Cologuard 8/24 and it was negative She complains of an itchy rash underneath her breast.  She has had candidiasis before  and would like a Diflucan as well as nystatin powder She denied to flu shot and pneumonia vaccine.     ROS    Objective:     BP 120/82 (BP Location: Left Arm, Patient Position: Sitting, Cuff Size: Normal)   Pulse 77   Temp (!) 97.3 F (36.3 C) (Oral)   Resp 16   Ht 5\' 5"  (1.651 m) Comment: per patient  Wt 233 lb 6.4 oz (105.9 kg)   LMP 01/25/2017 Comment: not active   SpO2 95%   BMI 38.84 kg/m    Physical Exam Vitals and nursing note reviewed.  Constitutional:       Appearance: Normal appearance.  HENT:     Head: Normocephalic and atraumatic.  Eyes:     Conjunctiva/sclera: Conjunctivae normal.  Cardiovascular:     Rate and Rhythm: Normal rate and regular rhythm.  Pulmonary:     Effort: Pulmonary effort is normal.     Breath sounds: Normal breath sounds.  Musculoskeletal:     Right lower leg: No edema.     Left lower leg: No edema.  Skin:    General: Skin is warm and dry.  Neurological:     Mental Status: She is alert and oriented to person, place, and time.  Psychiatric:        Mood and Affect: Mood normal.        Behavior: Behavior normal.        Thought Content: Thought content normal.        Judgment: Judgment normal.          No results found for any visits on 10/26/23.    The ASCVD Risk score (Arnett DK, et al., 2019) failed to calculate for the following reasons:   Cannot find a previous HDL lab   Cannot find a previous total cholesterol lab    Assessment & Plan:  Screening for colon cancer Assessment &  Plan: She had a Cologuard 04/2023   Controlled type 2 diabetes mellitus with complication, without long-term current use of insulin (HCC) Assessment & Plan: Has been out of Mounjaro for several months.  He only thing she takes for diabetes Jardiance 10 mg.  Starting a PA to get her Mounjaro.  She will have to start over with her taper because it has been so long.  Increase Jardiance to 25 mg daily.  Checking CMP and A1c  Orders: -     Hemoglobin A1c -     Lipid panel -     Comprehensive metabolic panel -     CBC; Future -     Microalbumin / creatinine urine ratio -     Tirzepatide; Inject 2.5 mg into the skin once a week.  Dispense: 2 mL; Refill: 0 -     Empagliflozin; Take 1 tablet (25 mg total) by mouth daily before breakfast.  Dispense: 30 tablet; Refill: 5  Elevated blood pressure reading Assessment & Plan: When she first started taking propranolol her blood pressure would get to 90/70.  This may be just a  outlier for her.  Checking CMP CBC and lipid profile will monitor her blood pressure.  Orders: -     CBC; Future  Encounter for screening mammogram for malignant neoplasm of breast Assessment & Plan: She had a Cologuard 04/2023  Orders: -     3D Screening Mammogram, Left and Right; Future  Tinea corporis Assessment & Plan: Has tinea corporis under her bra Diflucan 100 p.o. daily for 10 days and nystatin powder applied twice daily  Orders: -     Nystatin; Apply 1 Application topically 3 (three) times daily.  Dispense: 15 g; Refill: 0 -     Fluconazole; Take 1 tablet (150 mg total) by mouth daily.  Dispense: 7 tablet; Refill: 0  Screening mammogram for breast cancer Assessment & Plan: She had a Cologuard 04/2023  Orders: -     3D Screening Mammogram, Left and Right; Future     Return in about 3 months (around 01/24/2024).    Alease Medina, MD

## 2023-10-26 NOTE — Telephone Encounter (Signed)
Copied from CRM [id number]. Topic: Clinical - Prescription Issue >> Oct 26, 2023 11:54 AM Payton Doughty wrote: Reason for CRM: Margit Banda w/ the pt's insurance, [employer], needs OV notes supporting the pt's dx of diabetes. Fax to  [phone] Ref no. [id number]  Cb  [phone] option 3, option 1   PA initiated in Cover My Meds, we are awaiting A1C that was drawn today to result from [facility] to accompany office visit notes to send to insurance at this time.

## 2023-10-26 NOTE — Assessment & Plan Note (Signed)
When she first started taking propranolol her blood pressure would get to 90/70.  This may be just a outlier for her.  Checking CMP CBC and lipid profile will monitor her blood pressure.

## 2023-10-26 NOTE — Assessment & Plan Note (Signed)
Has been out of [facility] for several months.  He only thing she takes for diabetes Jardiance 10 mg.  Starting a PA to get her Mounjaro.  She will have to start over with her taper because it has been so long.  Increase Jardiance to 25 mg daily.  Checking CMP and A1c

## 2023-10-26 NOTE — Assessment & Plan Note (Addendum)
She had a Cologuard 04/2023

## 2023-10-26 NOTE — Assessment & Plan Note (Signed)
Has tinea corporis under her bra Diflucan 100 p.o. daily for 10 days and nystatin powder applied twice daily

## 2023-10-27 LAB — MICROALBUMIN / CREATININE URINE RATIO
Creatinine, Urine: 78.4 mg/dL
Microalb/Creat Ratio: 6 mg/g{creat} (ref 0–29)
Microalbumin, Urine: 5 ug/mL

## 2023-10-27 LAB — COMPREHENSIVE METABOLIC PANEL
ALT: 37 [IU]/L — ABNORMAL HIGH (ref 0–32)
AST: 32 [IU]/L (ref 0–40)
Albumin: 4.4 g/dL (ref 3.8–4.9)
Alkaline Phosphatase: 133 [IU]/L — ABNORMAL HIGH (ref 44–121)
BUN/Creatinine Ratio: 19 (ref 9–23)
BUN: 12 mg/dL (ref 6–24)
Bilirubin Total: 0.3 mg/dL (ref 0.0–1.2)
CO2: 23 mmol/L (ref 20–29)
Calcium: 9.7 mg/dL (ref 8.7–10.2)
Chloride: 102 mmol/L (ref 96–106)
Creatinine, Ser: 0.64 mg/dL (ref 0.57–1.00)
Globulin, Total: 2.4 g/dL (ref 1.5–4.5)
Glucose: 133 mg/dL — ABNORMAL HIGH (ref 70–99)
Potassium: 3.5 mmol/L (ref 3.5–5.2)
Sodium: 145 mmol/L — ABNORMAL HIGH (ref 134–144)
Total Protein: 6.8 g/dL (ref 6.0–8.5)
eGFR: 104 mL/min/{1.73_m2} (ref >59)

## 2023-10-27 LAB — HEMOGLOBIN A1C
Est. average glucose Bld gHb Est-mCnc: 197 mg/dL
Hgb A1c MFr Bld: 8.5 % — ABNORMAL HIGH (ref 4.8–5.6)

## 2023-10-27 LAB — LIPID PANEL
Chol/HDL Ratio: 4.7 {ratio} — ABNORMAL HIGH (ref 0.0–4.4)
Cholesterol, Total: 186 mg/dL (ref 100–199)
HDL: 40 mg/dL (ref >39)
LDL Chol Calc (NIH): 98 mg/dL (ref 0–99)
Triglycerides: 287 mg/dL — ABNORMAL HIGH (ref 0–149)
VLDL Cholesterol Cal: 48 mg/dL — ABNORMAL HIGH (ref 5–40)

## 2023-10-27 NOTE — Telephone Encounter (Signed)
Copied from CRM [id number]. Topic: General - Other >> Oct 27, 2023 10:20 AM Carlatta H wrote: Reason for CRM: Merry Proud needs something stating the patient was diagnosed with type 2 diabetes for pre authorization faxed [phone]//

## 2023-10-28 ENCOUNTER — Encounter: Payer: Self-pay | Admitting: Family Medicine

## 2023-10-28 ENCOUNTER — Telehealth: Payer: Self-pay

## 2023-10-28 NOTE — Telephone Encounter (Signed)
Appeal for [facility] completed. Awaiting for determination.

## 2023-10-28 NOTE — Telephone Encounter (Unsigned)
Copied from CRM [id number]. Topic: Clinical - Prescription Issue >> Oct 28, 2023  8:13 AM Gildardo Pounds wrote: Reason for CRM: Vincente Liberty, needs clinical notes or medical records with diagnosis of Type 2 diabetes for tirzepatide [facility]) 2.5 MG/0.5ML Pen. Callback number [phone] Option 3, option 4 then option 2 for Clinical Team; Fax number [phone]. Request was expedited with a turn around date of 10/29/2023 from Cheri Kearns, MD

## 2023-11-14 ENCOUNTER — Encounter: Payer: Self-pay | Admitting: Family Medicine

## 2023-11-14 ENCOUNTER — Telehealth: Payer: Self-pay

## 2023-11-14 ENCOUNTER — Other Ambulatory Visit: Payer: Self-pay | Admitting: Family Medicine

## 2023-11-14 DIAGNOSIS — B354 Tinea corporis: Secondary | ICD-10-CM

## 2023-11-14 MED ORDER — EZETIMIBE 10 MG PO TABS: 10.0000 mg | ORAL_TABLET | Freq: Every day | ORAL | 3 refills | Status: AC

## 2023-11-14 MED ORDER — NYSTATIN 100000 UNIT/GM EX POWD
1.0000 | Freq: Two times a day (BID) | CUTANEOUS | 1 refills | Status: AC
Start: 2023-11-14 — End: ?

## 2023-11-14 NOTE — Telephone Encounter (Signed)
 PA for Patricia Caldwell was approved 10/28/2023, Pharmacy confirmed and patient was called to notify. Left voicemail with information.

## 2023-11-18 ENCOUNTER — Ambulatory Visit: Payer: BC Managed Care – PPO | Admitting: Neurology

## 2023-12-10 ENCOUNTER — Other Ambulatory Visit: Payer: Self-pay | Admitting: Family Medicine

## 2023-12-16 ENCOUNTER — Other Ambulatory Visit: Payer: Self-pay | Admitting: Family Medicine

## 2023-12-16 MED ORDER — FLUOXETINE HCL 20 MG PO CAPS: 60.0000 mg | ORAL_CAPSULE | Freq: Every morning | ORAL | 1 refills | Status: DC

## 2023-12-16 NOTE — Telephone Encounter (Signed)
 Copied from CRM [id number]. Topic: Clinical - Medication Refill >> Dec 16, 2023  8:50 AM Louie Casa B wrote: Most Recent Primary Care Visit:  Provider: Alease Medina  Department: [facility]  Visit Type: NEW PATIENT  Date: 10/26/2023  Medication: FLUoxetine (PROZAC) 20 MG capsule  Has the patient contacted their pharmacy? Yes (Agent: If no, request that the patient contact the pharmacy for the refill. If patient does not wish to contact the pharmacy document the reason why and proceed with request.) (Agent: If yes, when and what did the pharmacy advise?)  Is this the correct pharmacy for this prescription? Yes If no, delete pharmacy and type the correct one.  This is the patient's preferred pharmacy:  [facility] [id number] [place], [place] - [address] [place] [place] [address] Phone: [phone] Fax: [phone]   Has the prescription been filled recently? Yes  Is the patient out of the medication? Yes  Has the patient been seen for an appointment in the last year OR does the patient have an upcoming appointment? Yes  Can we respond through MyChart? No  Agent: Please be advised that Rx refills may take up to 3 business days. We ask that you follow-up with your pharmacy.

## 2024-01-24 ENCOUNTER — Ambulatory Visit: Payer: BC Managed Care – PPO | Admitting: Family Medicine

## 2024-02-13 ENCOUNTER — Other Ambulatory Visit: Payer: Self-pay | Admitting: Family Medicine

## 2024-02-16 ENCOUNTER — Other Ambulatory Visit: Payer: Self-pay

## 2024-02-16 MED ORDER — ESOMEPRAZOLE MAGNESIUM 40 MG PO CPDR: 40.0000 mg | DELAYED_RELEASE_CAPSULE | Freq: Every day | ORAL | 1 refills | Status: AC

## 2024-02-16 MED ORDER — FLUOXETINE HCL 20 MG PO CAPS: 60.0000 mg | ORAL_CAPSULE | Freq: Every morning | ORAL | 1 refills | Status: AC

## 2024-02-16 NOTE — Addendum Note (Signed)
 Addended by: Evans Him on: 02/16/2024 01:50 PM   Modules accepted: Orders
